# Patient Record
Sex: Male | Born: 1955 | Race: White | Hispanic: No | Smoking: Never smoker
Health system: Southern US, Community
[De-identification: ages and names within clinical notes are randomized; demographics above are authoritative.]

## PROBLEM LIST (undated history)

## (undated) DIAGNOSIS — Z87442 Personal history of urinary calculi: Secondary | ICD-10-CM

## (undated) DIAGNOSIS — R569 Unspecified convulsions: Secondary | ICD-10-CM

## (undated) DIAGNOSIS — K4020 Bilateral inguinal hernia, without obstruction or gangrene, not specified as recurrent: Secondary | ICD-10-CM

## (undated) DIAGNOSIS — M1990 Unspecified osteoarthritis, unspecified site: Secondary | ICD-10-CM

## (undated) DIAGNOSIS — E782 Mixed hyperlipidemia: Secondary | ICD-10-CM

## (undated) DIAGNOSIS — Z973 Presence of spectacles and contact lenses: Secondary | ICD-10-CM

---

## 1979-07-24 HISTORY — PX: CYSTOSCOPY/RETROGRADE/URETEROSCOPY/STONE EXTRACTION WITH BASKET: SHX5317

## 1980-07-23 HISTORY — PX: APPENDECTOMY: SHX54

## 2004-06-15 ENCOUNTER — Emergency Department (HOSPITAL_COMMUNITY): Admission: EM | Admit: 2004-06-15 | Discharge: 2004-06-15 | Payer: Self-pay | Admitting: Emergency Medicine

## 2004-06-21 ENCOUNTER — Emergency Department (HOSPITAL_COMMUNITY): Admission: EM | Admit: 2004-06-21 | Discharge: 2004-06-21 | Payer: Self-pay | Admitting: *Deleted

## 2005-06-17 IMAGING — CT CT ABDOMEN W/O CM
1 series · 15 of 32 positions shown, 19 images · non-contrast
Comparison: 06/15/2004.

CLINICAL DATA: Increased right flank pain. Recently demonstrated right ureteral
calculus.

ABDOMEN CT WITHOUT CONTRAST - URINARY STONE PROTOCOL
TECHNIQUE: Multidetector CT imaging of the abdomen was performed following the
urinary stone protocol.  No oral or intravenous contrast was administered.
TECHNIQUE: Multidetector CT imaging of the pelvis was performed following the

[Series 2: renal stone · axial · 0.70mm/px · z∈[-510,-115]mm · 15 of 87 slices shown, 19 images]
[im 6/87  soft-tissue]
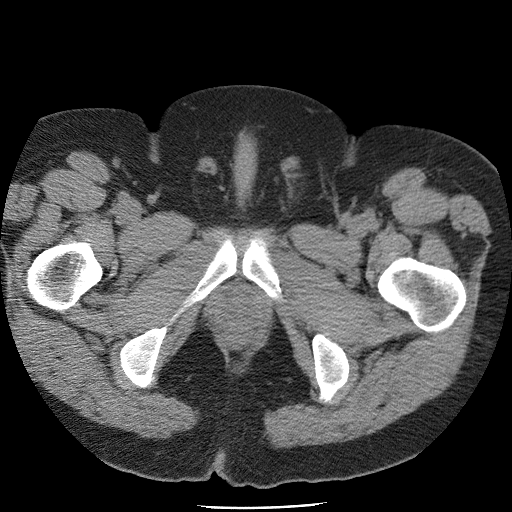
[im 6/87  bone]
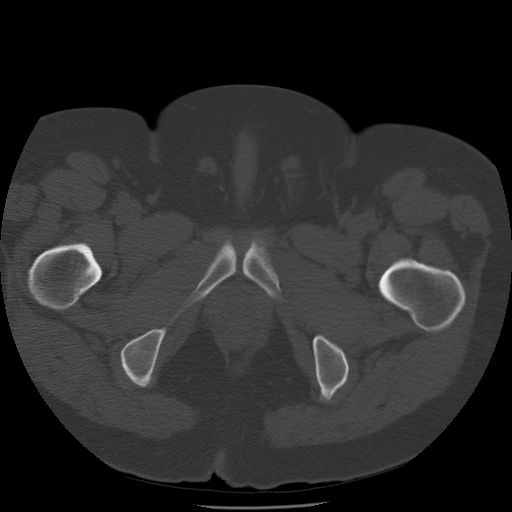
[im 12/87  soft-tissue]
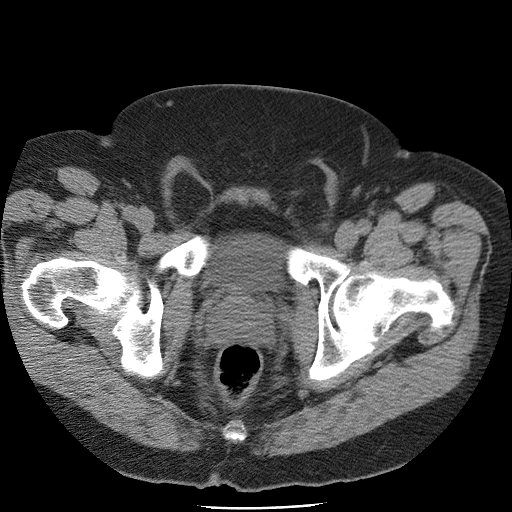
[im 17/87  soft-tissue]
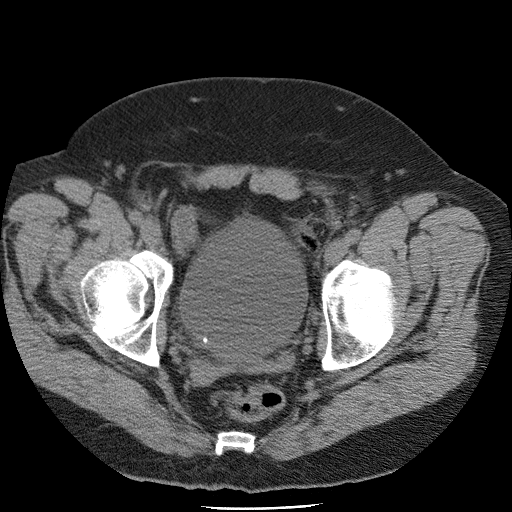
[im 25/87  soft-tissue]
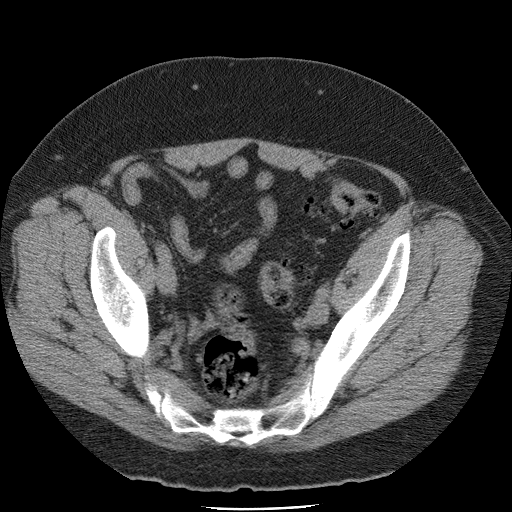
[im 31/87  soft-tissue]
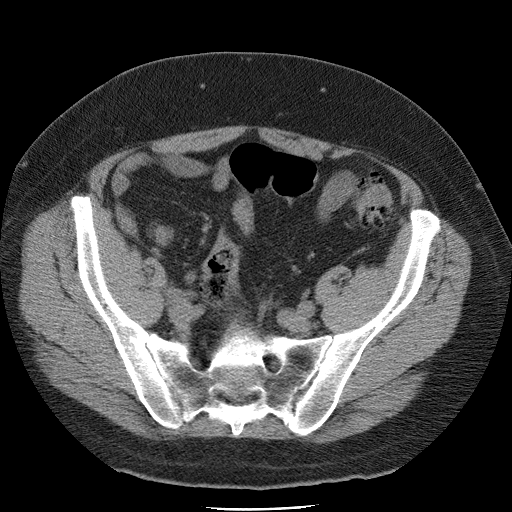
[im 37/87  soft-tissue]
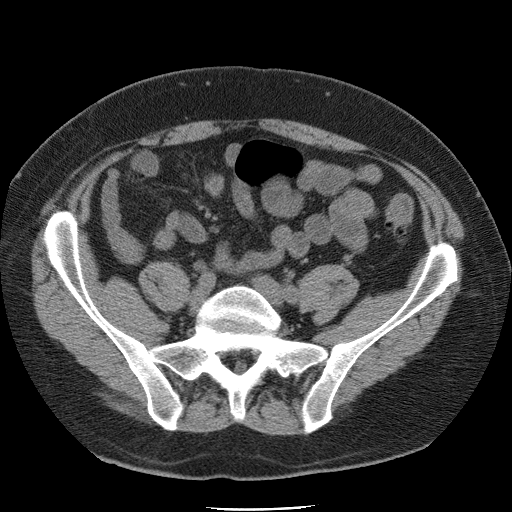
[im 45/87  soft-tissue]
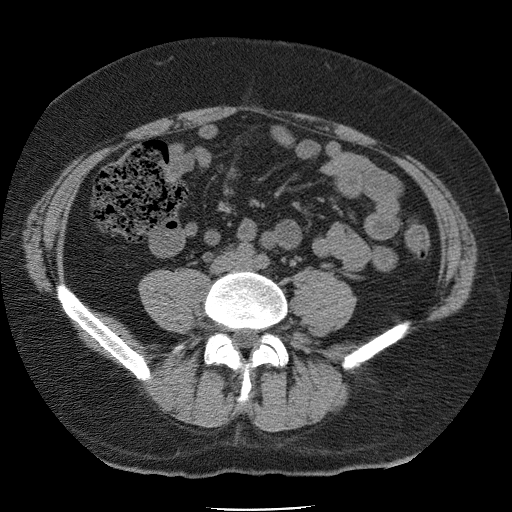
[im 50/87  soft-tissue]
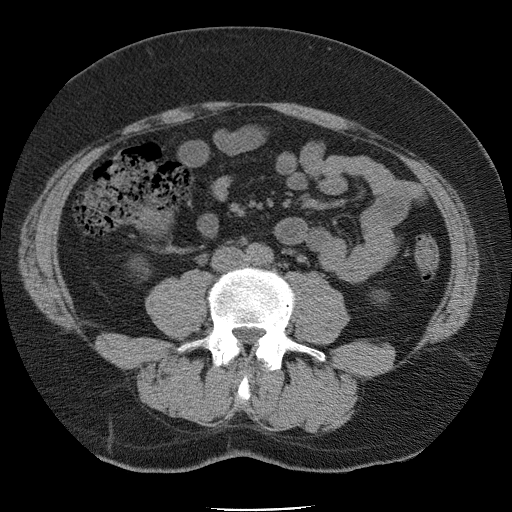
[im 56/87  soft-tissue]
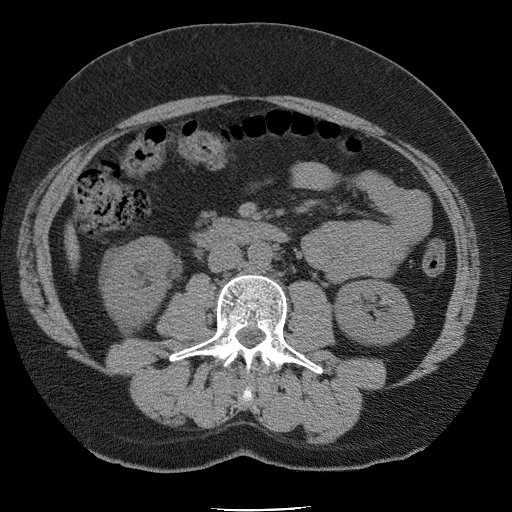
[im 56/87  bone]
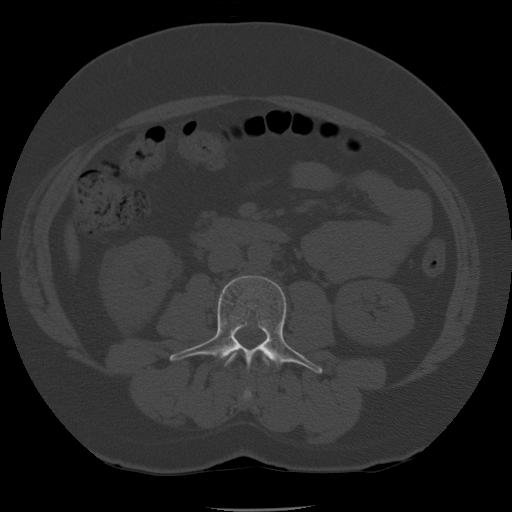
[im 62/87  soft-tissue]
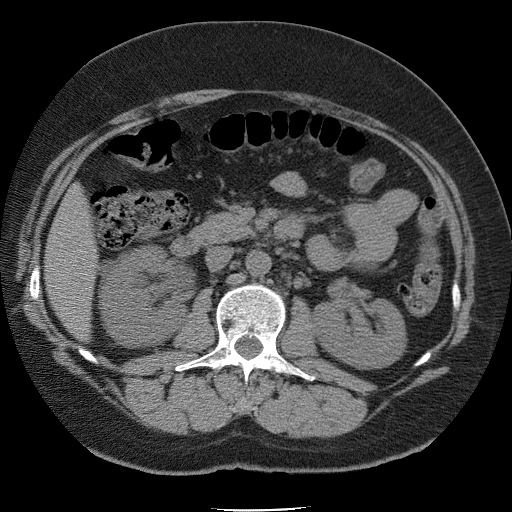
[im 70/87  soft-tissue]
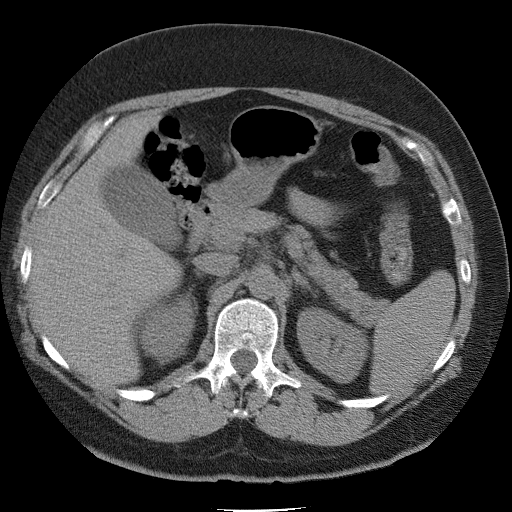
[im 75/87  soft-tissue]
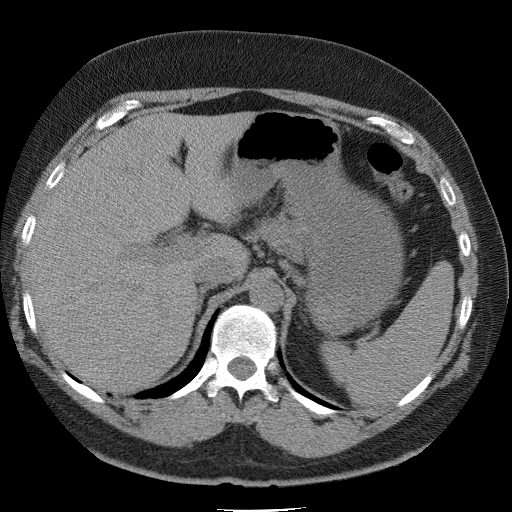
[im 75/87  lung]
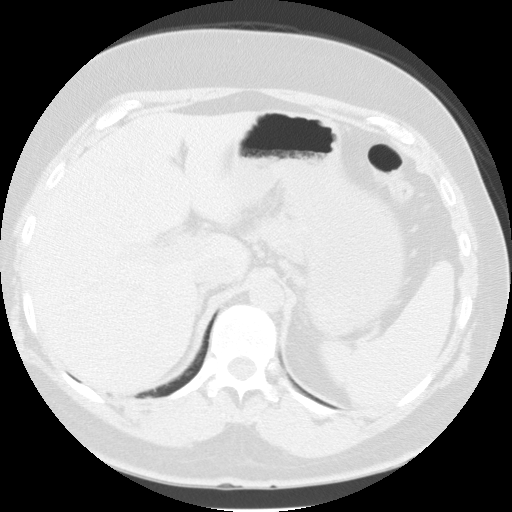
[im 78/87  lung]
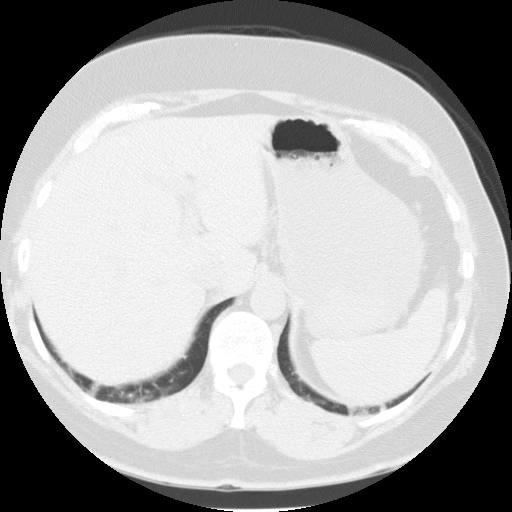
[im 81/87  soft-tissue]
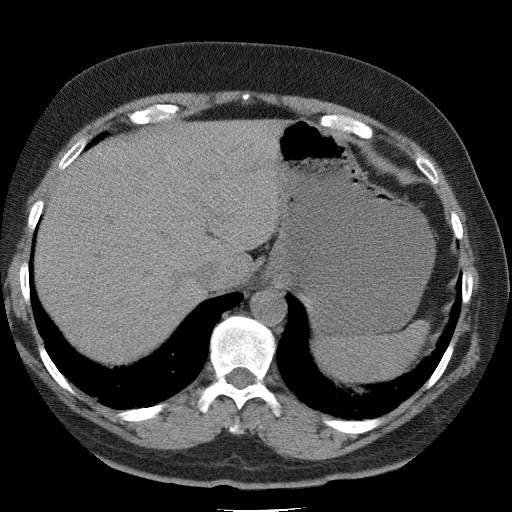
[im 81/87  lung]
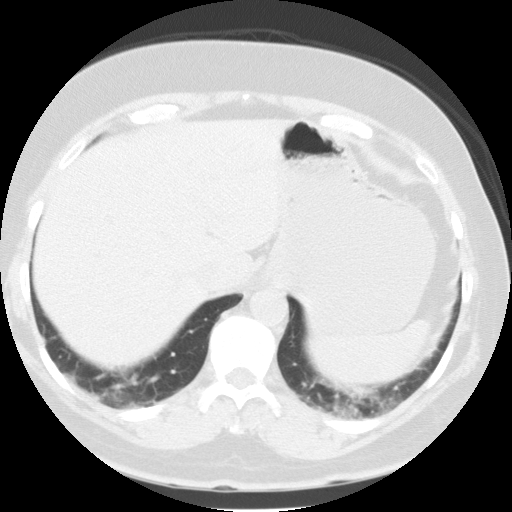
[im 84/87  lung]
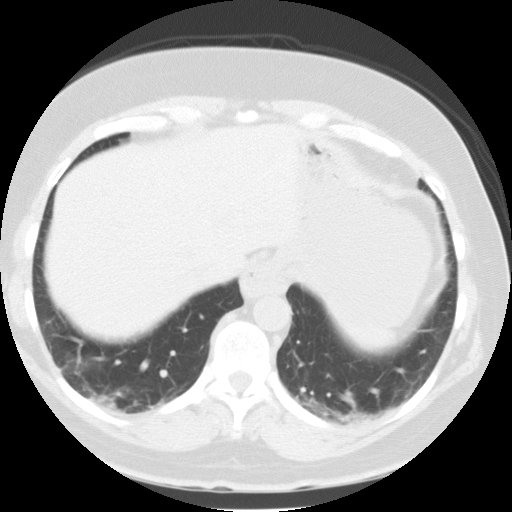

[15 of 32 positions shown; findings below may reference images not displayed]

FINDINGS: Mildly progressive dilatation of the right renal collecting system
and ureter. Interval mild right perinephric fluid. Mild interval increase in
size of the right kidney with interval ill-defined margins. Stable normal
appearing left kidney. No ureteral calculi in the abdomen. No renal calculi
seen. Lumbar spine degenerative changes.

IMPRESSION

Mild progressive right hydronephrosis and hydroureter with interval mild
enlargement of the right kidney and interval mild right perinephric fluid.

PELVIS CT WITHOUT CONTRAST - URINARY STONE PROTOCOL
FINDINGS: Mildly progressive dilatation of the right ureter to the level of a 4
mm calculus at the ureterovesical junction. Multiple sigmoid colon diverticula.
Unremarkable bones.

IMPRESSION

Mildly progressive dilatation of the right ureter due to a 4 mm distal ureteral
calculus at the ureterovesical junction.

## 2008-07-23 HISTORY — PX: COLONOSCOPY: SHX174

## 2011-07-24 DIAGNOSIS — R569 Unspecified convulsions: Secondary | ICD-10-CM

## 2011-07-24 HISTORY — DX: Unspecified convulsions: R56.9

## 2012-03-11 ENCOUNTER — Ambulatory Visit (INDEPENDENT_AMBULATORY_CARE_PROVIDER_SITE_OTHER): Payer: BC Managed Care – PPO | Admitting: Surgery

## 2012-04-18 ENCOUNTER — Encounter (INDEPENDENT_AMBULATORY_CARE_PROVIDER_SITE_OTHER): Payer: Self-pay | Admitting: General Surgery

## 2012-04-22 ENCOUNTER — Encounter (INDEPENDENT_AMBULATORY_CARE_PROVIDER_SITE_OTHER): Payer: Self-pay | Admitting: Surgery

## 2012-04-22 ENCOUNTER — Ambulatory Visit (INDEPENDENT_AMBULATORY_CARE_PROVIDER_SITE_OTHER): Payer: BC Managed Care – PPO | Admitting: Surgery

## 2012-04-22 VITALS — BP 110/70 | HR 80 | Resp 14 | Ht 74.0 in | Wt 231.0 lb

## 2012-04-22 DIAGNOSIS — K4020 Bilateral inguinal hernia, without obstruction or gangrene, not specified as recurrent: Secondary | ICD-10-CM | POA: Insufficient documentation

## 2012-04-22 DIAGNOSIS — K4090 Unilateral inguinal hernia, without obstruction or gangrene, not specified as recurrent: Secondary | ICD-10-CM

## 2012-04-22 NOTE — Progress Notes (Signed)
Subjective:     Patient ID: Edward Hart, male   DOB: 1955/11/11, 56 y.o.   MRN: [id number]  HPI  AVA DEGUIRE  1956-06-03 [id number]  Patient Care Team: Lupita Raider, MD as PCP - General (Family Medicine)  This patient is a 56 y.o.male who presents today for surgical evaluation.   For visit: Right inguinal hernia.  Pleasant active male.  Pleas Koch and works in [place] many months of the year.  Was found to have an inguinal hernia on physical exam a few years ago.  Only occasionally sensitive.  No severe pain.  Walks about a mile or two a day.  Regular bowel movements.  Normal colonoscopy three years ago.  Had an open appendectomy done in the 1980s.  No other surgeries.  No history of infections.  Because he was due to do a lot more traveling this year, it was recommended by his primary care physician to meet up with a surgeon to consider surgery at some point.  Patient Active Problem List  Diagnosis  . Inguinal hernia, right    Past Medical History  Diagnosis Date  . HLD (hyperlipidemia)     Past Surgical History  Procedure Date  . Appendectomy 1984  . Kidney stone surgery     History   Social History  . Marital Status: Married    Spouse Name: N/A    Number of Children: N/A  . Years of Education: N/A   Occupational History  . Not on file.   Social History Main Topics  . Smoking status: Never Smoker   . Smokeless tobacco: Not on file  . Alcohol Use: No  . Drug Use: No  . Sexually Active: Not on file   Other Topics Concern  . Not on file   Social History Narrative  . No narrative on file    Family History  Problem Relation Age of Onset  . Lung cancer Father   . Alzheimer's disease Mother   . Diabetes Brother     Current Outpatient Prescriptions  Medication Sig Dispense Refill  . simvastatin (ZOCOR) 40 MG tablet Take 40 mg by mouth every evening. Half tablet         No Known Allergies  BP 110/70  Pulse 80  Resp 14  Ht 6\' 2"  (1.88 m)  Wt 231  lb (104.781 kg)  BMI 29.66 kg/m2  No results found.   Review of Systems  Constitutional: Negative for fever, chills and diaphoresis.  HENT: Negative for nosebleeds, sore throat, facial swelling, mouth sores, trouble swallowing and ear discharge.   Eyes: Negative for photophobia, discharge and visual disturbance.  Respiratory: Negative for choking, chest tightness, shortness of breath and stridor.   Cardiovascular: Negative for chest pain and palpitations.  Gastrointestinal: Negative for nausea, vomiting, abdominal pain, diarrhea, constipation, blood in stool, abdominal distention, anal bleeding and rectal pain.  Genitourinary: Negative for dysuria, urgency, difficulty urinating and testicular pain.  Musculoskeletal: Negative for myalgias, back pain, arthralgias and gait problem.  Skin: Negative for color change, pallor, rash and wound.  Neurological: Negative for dizziness, speech difficulty, weakness, numbness and headaches.  Hematological: Negative for adenopathy. Does not bruise/bleed easily.  Psychiatric/Behavioral: Negative for hallucinations, confusion and agitation.       Objective:   Physical Exam  Constitutional: He is oriented to person, place, and time. He appears well-developed and well-nourished. No distress.  HENT:  Head: Normocephalic.  Mouth/Throat: Oropharynx is clear and moist. No oropharyngeal exudate.  Eyes: Conjunctivae normal and EOM  are normal. Pupils are equal, round, and reactive to light. No scleral icterus.  Neck: Normal range of motion. Neck supple. No tracheal deviation present.  Cardiovascular: Normal rate, regular rhythm and intact distal pulses.   Pulmonary/Chest: Effort normal and breath sounds normal. No respiratory distress.  Abdominal: Soft. He exhibits no distension. There is no tenderness. A hernia is present. Hernia confirmed positive in the right inguinal area and confirmed positive in the left inguinal area.    Musculoskeletal: Normal range  of motion. He exhibits no tenderness.  Lymphadenopathy:    He has no cervical adenopathy.       Right: No inguinal adenopathy present.       Left: No inguinal adenopathy present.  Neurological: He is alert and oriented to person, place, and time. No cranial nerve deficit. He exhibits normal muscle tone. Coordination normal.  Skin: Skin is warm and dry. No rash noted. He is not diaphoretic. No erythema. No pallor.  Psychiatric: He has a normal mood and affect. His behavior is normal. Judgment and thought content normal.       Assessment:     BIH, R>L    Plan:     I think he needs repair.  Because they're bilateral, I think he would benefit from a laparoscopic approach.  He is interested in this.  However, he travels to [place] for work and is often there for months.  He was hoping to wait 6-12 months before doing this.  He denies any symptoms.  I cautioned him and waning of long, but he's had this for several years so it is probably safe.  He feels reassured and understands the risk of incarceration and stimulation and to call us if he has worsening symptoms to do it sooner.  I noted he would need at least a few weeks to have time to recover from surgery.  This is not a situation where we do surgery and he goes flying to [place] a few days later.  He expressed understanding.  The anatomy & physiology of the abdominal wall and pelvic floor was discussed.  The pathophysiology of hernias in the inguinal and pelvic region was discussed.  Natural history risks such as progressive enlargement, pain, incarceration & strangulation was discussed.   Contributors to complications such as smoking, obesity, diabetes, prior surgery, etc were discussed.    I feel the risks of no intervention will lead to serious problems that outweigh the operative risks; therefore, I recommended surgery to reduce and repair the hernia.  I explained laparoscopic techniques with possible need for an open approach.  I noted usual  use of mesh to patch and/or buttress hernia repair  Risks such as bleeding, infection, abscess, need for further treatment, heart attack, death, and other risks were discussed.  I noted a good likelihood this will help address the problem.   Goals of post-operative recovery were discussed as well.  Possibility that this will not correct all symptoms was explained.  I stressed the importance of low-impact activity, aggressive pain control, avoiding constipation, & not pushing through pain to minimize risk of post-operative chronic pain or injury. Possibility of reherniation was discussed.  We will work to minimize complications.     An educational handout further explaining the pathology & treatment options was given as well.  Questions were answered.  The patient expresses understanding & wishes to proceed with surgery in a few months.

## 2012-04-22 NOTE — Patient Instructions (Addendum)
See the Handout(s) we gave you.  Consider surgery.  Please call our office at [phone] if you wish to schedule surgery or if you have further questions / concerns.    Hernia A hernia occurs when an internal organ pushes out through a weak spot in the abdominal wall. Hernias most commonly occur in the groin and around the navel. Hernias often can be pushed back into place (reduced). Most hernias tend to get worse over time. Some abdominal hernias can get stuck in the opening (irreducible or incarcerated hernia) and cannot be reduced. An irreducible abdominal hernia which is tightly squeezed into the opening is at risk for impaired blood supply (strangulated hernia). A strangulated hernia is a medical emergency. Because of the risk for an irreducible or strangulated hernia, surgery may be recommended to repair a hernia. CAUSES   Heavy lifting.   Prolonged coughing.   Straining to have a bowel movement.   A cut (incision) made during an abdominal surgery.  HOME CARE INSTRUCTIONS   Bed rest is not required. You may continue your normal activities.   Avoid lifting more than 10 pounds (4.5 kg) or straining.   Cough gently. If you are a smoker it is best to stop. Even the best hernia repair can break down with the continual strain of coughing. Even if you do not have your hernia repaired, a cough will continue to aggravate the problem.   Do not wear anything tight over your hernia. Do not try to keep it in with an outside bandage or truss. These can damage abdominal contents if they are trapped within the hernia sac.   Eat a normal diet.   Avoid constipation. Straining over long periods of time will increase hernia size and encourage breakdown of repairs. If you cannot do this with diet alone, stool softeners may be used.  SEEK IMMEDIATE MEDICAL CARE IF:   You have a fever.   You develop increasing abdominal pain.   You feel nauseous or vomit.   Your hernia is stuck outside the  abdomen, looks discolored, feels hard, or is tender.   You have any changes in your bowel habits or in the hernia that are unusual for you.   You have increased pain or swelling around the hernia.   You cannot push the hernia back in place by applying gentle pressure while lying down.  MAKE SURE YOU:   Understand these instructions.   Will watch your condition.   Will get help right away if you are not doing well or get worse.  Document Released: 07/09/2005 Document Revised: 06/28/2011 Document Reviewed: 02/26/2008 Exit[employer] Patient Information 2012 [employer]. 

## 2012-04-30 ENCOUNTER — Encounter (INDEPENDENT_AMBULATORY_CARE_PROVIDER_SITE_OTHER): Payer: Self-pay

## 2012-08-07 DIAGNOSIS — G40209 Localization-related (focal) (partial) symptomatic epilepsy and epileptic syndromes with complex partial seizures, not intractable, without status epilepticus: Secondary | ICD-10-CM | POA: Insufficient documentation

## 2016-06-03 DIAGNOSIS — Z23 Encounter for immunization: Secondary | ICD-10-CM | POA: Diagnosis not present

## 2016-07-25 DIAGNOSIS — G40109 Localization-related (focal) (partial) symptomatic epilepsy and epileptic syndromes with simple partial seizures, not intractable, without status epilepticus: Secondary | ICD-10-CM | POA: Diagnosis not present

## 2016-09-11 DIAGNOSIS — Z0000 Encounter for general adult medical examination without abnormal findings: Secondary | ICD-10-CM | POA: Diagnosis not present

## 2016-09-11 DIAGNOSIS — E669 Obesity, unspecified: Secondary | ICD-10-CM | POA: Diagnosis not present

## 2016-09-11 DIAGNOSIS — N528 Other male erectile dysfunction: Secondary | ICD-10-CM | POA: Diagnosis not present

## 2016-09-11 DIAGNOSIS — E782 Mixed hyperlipidemia: Secondary | ICD-10-CM | POA: Diagnosis not present

## 2016-09-12 ENCOUNTER — Ambulatory Visit: Payer: Self-pay | Admitting: Surgery

## 2016-09-12 DIAGNOSIS — K4020 Bilateral inguinal hernia, without obstruction or gangrene, not specified as recurrent: Secondary | ICD-10-CM | POA: Diagnosis not present

## 2016-09-12 DIAGNOSIS — Z01818 Encounter for other preprocedural examination: Secondary | ICD-10-CM | POA: Diagnosis not present

## 2016-09-12 NOTE — H&P (Signed)
Edward Hart 09/12/2016 8:35 AM Location: [facility] Patient #: [id number] DOB: 12/21/1955 Married / Language: English / Race: White Male  History of Present Illness (Karson Chicas C. Kenitra Leventhal MD; 09/12/2016 9:10 AM) Patient words: NP BIH.  The patient is a 61 year old male who presents with an inguinal hernia. Note for "Inguinal hernia": Patient sent for surgical evaluation. Requested primary care physician Dr. Kimberlee Shaw. Concern for worsening inguinal hernias.  61-year-old male. Travels internationally often. Increasingly symptomatic right internal hernia. I felt like he had a small left contralateral hernia as well. I recommended laparoscopic bilateral inguinal hernia repairs with mesh in 2013. Due to his heavy travel schedule, he never felt he can get a time to get done. However the right groin swelling has gotten larger and become more painful now. Was recommended he reconsider surgery. Patient does not smoke. He can walk 2 miles a day without difficulty. Usually moves his bowels once a day. Sometimes every other day. Had an appendectomy but no other abdominal surgeries. No cardiopulmonary events. Not on blood thinners. Otherwise rather active.   Past Surgical History (Robin Gwynn, RMA; 09/12/2016 8:36 AM) Appendectomy Oral Surgery  Diagnostic Studies History (Robin Gwynn, RMA; 09/12/2016 8:36 AM) Colonoscopy 5-10 years ago  Allergies (Robin Gwynn, RMA; 09/12/2016 8:39 AM) No Known Drug Allergies 09/12/2016  Medication History (Robin Gwynn, RMA; 09/12/2016 8:44 AM) Fish Oil (435MG Capsule, Oral) Active. Keppra (Oral) Specific strength unknown - Active. Aspirin (81MG Tablet Chewable, Oral) Active. Medications Reconciled  Social History (Robin Gwynn, RMA; 09/12/2016 8:36 AM) Alcohol use Occasional alcohol use. Caffeine use Coffee, Tea. No drug use Tobacco use Never smoker.  Family History (Robin Gwynn, RMA; 09/12/2016 8:36 AM) Diabetes Mellitus  Brother.  Other Problems (Robin Gwynn, RMA; 09/12/2016 8:36 AM) Kidney Stone Seizure Disorder     Review of Systems (Robin Gwynn RMA; 09/12/2016 8:36 AM) General Not Present- Appetite Loss, Chills, Fatigue, Fever, Night Sweats, Weight Gain and Weight Loss. Skin Not Present- Change in Wart/Mole, Dryness, Hives, Jaundice, New Lesions, Non-Healing Wounds, Rash and Ulcer. HEENT Present- Wears glasses/contact lenses. Not Present- Earache, Hearing Loss, Hoarseness, Nose Bleed, Oral Ulcers, Ringing in the Ears, Seasonal Allergies, Sinus Pain, Sore Throat, Visual Disturbances and Yellow Eyes. Respiratory Not Present- Bloody sputum, Chronic Cough, Difficulty Breathing, Snoring and Wheezing. Breast Not Present- Breast Mass, Breast Pain, Nipple Discharge and Skin Changes. Cardiovascular Not Present- Chest Pain, Difficulty Breathing Lying Down, Leg Cramps, Palpitations, Rapid Heart Rate, Shortness of Breath and Swelling of Extremities. Gastrointestinal Not Present- Abdominal Pain, Bloating, Bloody Stool, Change in Bowel Habits, Chronic diarrhea, Constipation, Difficulty Swallowing, Excessive gas, Gets full quickly at meals, Hemorrhoids, Indigestion, Nausea, Rectal Pain and Vomiting. Male Genitourinary Present- Urgency. Not Present- Blood in Urine, Change in Urinary Stream, Frequency, Impotence, Nocturia, Painful Urination and Urine Leakage. Musculoskeletal Not Present- Back Pain, Joint Pain, Joint Stiffness, Muscle Pain, Muscle Weakness and Swelling of Extremities. Neurological Present- Seizures. Not Present- Decreased Memory, Fainting, Headaches, Numbness, Tingling, Tremor, Trouble walking and Weakness. Psychiatric Not Present- Anxiety, Bipolar, Change in Sleep Pattern, Depression, Fearful and Frequent crying. Endocrine Not Present- Cold Intolerance, Excessive Hunger, Hair Changes, Heat Intolerance, Hot flashes and New Diabetes. Hematology Not Present- Blood Thinners, Easy Bruising, Excessive bleeding,  Gland problems, HIV and Persistent Infections.  Vitals (Robin Gwynn RMA; 09/12/2016 8:45 AM) 09/12/2016 8:44 AM Weight: 228 lb Height: 73in Body Surface Area: 2.28 m Body Mass Index: 30.08 kg/m  Temp.: 97.6F  Pulse: 81 (Regular)  BP: 112/60 (Sitting, Left Arm, Standard)        Physical Exam (Elbridge Magowan C. Naeemah Jasmer MD; 09/12/2016 9:05 AM)  General Mental Status-Alert. General Appearance-Not in acute distress, Not Sickly. Orientation-Oriented X3. Hydration-Well hydrated. Voice-Normal.  Integumentary Global Assessment Upon inspection and palpation of skin surfaces of the - Axillae: non-tender, no inflammation or ulceration, no drainage. and Distribution of scalp and body hair is normal. General Characteristics Temperature - normal warmth is noted.  Head and Neck Head-normocephalic, atraumatic with no lesions or palpable masses. Face Global Assessment - atraumatic, no absence of expression. Neck Global Assessment - no abnormal movements, no bruit auscultated on the right, no bruit auscultated on the left, no decreased range of motion, non-tender. Trachea-midline. Thyroid Gland Characteristics - non-tender.  Eye Eyeball - Left-Extraocular movements intact, No Nystagmus. Eyeball - Right-Extraocular movements intact, No Nystagmus. Cornea - Left-No Hazy. Cornea - Right-No Hazy. Sclera/Conjunctiva - Left-No scleral icterus, No Discharge. Sclera/Conjunctiva - Right-No scleral icterus, No Discharge. Pupil - Left-Direct reaction to light normal. Pupil - Right-Direct reaction to light normal.  ENMT Ears Pinna - Left - no drainage observed, no generalized tenderness observed. Right - no drainage observed, no generalized tenderness observed. Nose and Sinuses External Inspection of the Nose - no destructive lesion observed. Inspection of the nares - Left - quiet respiration. Right - quiet respiration. Mouth and Throat Lips - Upper Lip - no  fissures observed, no pallor noted. Lower Lip - no fissures observed, no pallor noted. Nasopharynx - no discharge present. Oral Cavity/Oropharynx - Tongue - no dryness observed. Oral Mucosa - no cyanosis observed. Hypopharynx - no evidence of airway distress observed.  Chest and Lung Exam Inspection Movements - Normal and Symmetrical. Accessory muscles - No use of accessory muscles in breathing. Palpation Palpation of the chest reveals - Non-tender. Auscultation Breath sounds - Normal and Clear.  Cardiovascular Auscultation Rhythm - Regular. Murmurs & Other Heart Sounds - Auscultation of the heart reveals - No Murmurs and No Systolic Clicks.  Abdomen Inspection Inspection of the abdomen reveals - No Visible peristalsis and No Abnormal pulsations. Umbilicus - No Bleeding, No Urine drainage. Palpation/Percussion Palpation and Percussion of the abdomen reveal - Soft, Non Tender, No Rebound tenderness, No Rigidity (guarding) and No Cutaneous hyperesthesia. Note: Abdomen soft. Nontender, nondistended. No guarding. No diastasis. No umbilical nor other hernias  Male Genitourinary Sexual Maturity Tanner 5 - Adult hair pattern and Adult penile size and shape. Note: Large right inguinal hernia going down to proximal scrotum. Sensitive smaller left inguinal hernia. Normal external genitalia. Epididymi, testes, and spermatic cords normal without any masses.  Peripheral Vascular Upper Extremity Inspection - Left - No Cyanotic nailbeds, Not Ischemic. Right - No Cyanotic nailbeds, Not Ischemic.  Neurologic Neurologic evaluation reveals -normal attention span and ability to concentrate, able to name objects and repeat phrases. Appropriate fund of knowledge , normal sensation and normal coordination. Mental Status Affect - not angry, not paranoid. Cranial Nerves-Normal Bilaterally. Gait-Normal.  Neuropsychiatric Mental status exam performed with findings of-able to articulate well with  normal speech/language, rate, volume and coherence, thought content normal with ability to perform basic computations and apply abstract reasoning and no evidence of hallucinations, delusions, obsessions or homicidal/suicidal ideation.  Musculoskeletal Global Assessment Spine, Ribs and Pelvis - no instability, subluxation or laxity. Right Upper Extremity - no instability, subluxation or laxity.  Lymphatic Head & Neck  General Head & Neck Lymphatics: Bilateral - Description - No Localized lymphadenopathy. Axillary  General Axillary Region: Bilateral - Description - No Localized lymphadenopathy. Femoral & Inguinal  Generalized Femoral & Inguinal Lymphatics: Left -   Description - No Localized lymphadenopathy. Right - Description - No Localized lymphadenopathy.    Assessment & Plan (Jerimah Witucki C. Reg Bircher MD; 09/12/2016 9:06 AM)  BILATERAL INGUINAL HERNIA WITHOUT OBSTRUCTION OR GANGRENE, RECURRENCE NOT SPECIFIED (K40.20) Impression: Enlarging and more symptomatic right inguinal hernia with left inguinal hernia as well.  Held off on surgery as recommended in 2013. Increasingly symptomatic so more motivated to get repaired now.  We will coordinate a convenient time. Patient travels internationally and was hoping for a few weeks from now when he has time to recover more safely. We will try to accommodate him.  PREOP - ING HERNIA - ENCOUNTER FOR PREOPERATIVE EXAMINATION FOR GENERAL SURGICAL PROCEDURE (Z01.818)  Current Plans You are being scheduled for surgery- Our schedulers will call you.  You should hear from our office's scheduling department within 5 working days about the location, date, and time of surgery. We try to make accommodations for patient's preferences in scheduling surgery, but sometimes the OR schedule or the surgeon's schedule prevents us from making those accommodations.  If you have not heard from our office ([phone]) in 5 working days, call the office and ask for your  surgeon's nurse.  If you have other questions about your diagnosis, plan, or surgery, call the office and ask for your surgeon's nurse.  Written instructions provided The anatomy & physiology of the abdominal wall and pelvic floor was discussed. The pathophysiology of hernias in the inguinal and pelvic region was discussed. Natural history risks such as progressive enlargement, pain, incarceration, and strangulation was discussed. Contributors to complications such as smoking, obesity, diabetes, prior surgery, etc were discussed.  I feel the risks of no intervention will lead to serious problems that outweigh the operative risks; therefore, I recommended surgery to reduce and repair the hernia. I explained laparoscopic techniques with possible need for an open approach. I noted usual use of mesh to patch and/or buttress hernia repair  Risks such as bleeding, infection, abscess, need for further treatment, heart attack, death, and other risks were discussed. I noted a good likelihood this will help address the problem. Goals of post-operative recovery were discussed as well. Possibility that this will not correct all symptoms was explained. I stressed the importance of low-impact activity, aggressive pain control, avoiding constipation, & not pushing through pain to minimize risk of post-operative chronic pain or injury. Possibility of reherniation was discussed. We will work to minimize complications.  An educational handout further explaining the pathology & treatment options was given as well. Questions were answered. The patient expresses understanding & wishes to proceed with surgery.  Pt Education - Pamphlet Given - Laparoscopic Hernia Repair: discussed with patient and provided information. Pt Education - [facility] Pain Control (Agape Hardiman) Pt Education - [facility] Hernia Post-Op HCI (Azrael Huss): discussed with patient and provided information.  Adriauna Campton C. Reena Borromeo, M.D., F.A.C.S. Gastrointestinal  and Minimally Invasive Surgery [facility] [address], [address] [place], [place] [address] [phone] Main / Paging   

## 2016-10-02 ENCOUNTER — Encounter (HOSPITAL_BASED_OUTPATIENT_CLINIC_OR_DEPARTMENT_OTHER): Payer: Self-pay | Admitting: *Deleted

## 2016-10-02 NOTE — Progress Notes (Signed)
NPO AFTER MN.  ARRIVE AT 0600.  NEEDS HG.  WILL DO HIBICLENS SHOWER HS BEFORE AND AM DOS.

## 2016-10-08 ENCOUNTER — Encounter (HOSPITAL_BASED_OUTPATIENT_CLINIC_OR_DEPARTMENT_OTHER)
Admission: RE | Disposition: A | Discharge status: Home / Self Care | Payer: Self-pay | Source: Ambulatory Visit | Attending: Surgery

## 2016-10-08 ENCOUNTER — Ambulatory Visit (HOSPITAL_BASED_OUTPATIENT_CLINIC_OR_DEPARTMENT_OTHER): Payer: BLUE CROSS/BLUE SHIELD | Admitting: Anesthesiology

## 2016-10-08 ENCOUNTER — Ambulatory Visit (HOSPITAL_BASED_OUTPATIENT_CLINIC_OR_DEPARTMENT_OTHER)
Admission: RE | Admit: 2016-10-08 | Discharge: 2016-10-08 | Disposition: A | Discharge status: Home / Self Care | Payer: BLUE CROSS/BLUE SHIELD | Source: Ambulatory Visit | Attending: Surgery | Admitting: Surgery

## 2016-10-08 ENCOUNTER — Encounter (HOSPITAL_BASED_OUTPATIENT_CLINIC_OR_DEPARTMENT_OTHER): Payer: Self-pay | Admitting: Anesthesiology

## 2016-10-08 DIAGNOSIS — Z79899 Other long term (current) drug therapy: Secondary | ICD-10-CM | POA: Insufficient documentation

## 2016-10-08 DIAGNOSIS — M1990 Unspecified osteoarthritis, unspecified site: Secondary | ICD-10-CM | POA: Diagnosis not present

## 2016-10-08 DIAGNOSIS — R569 Unspecified convulsions: Secondary | ICD-10-CM | POA: Diagnosis not present

## 2016-10-08 DIAGNOSIS — Z7982 Long term (current) use of aspirin: Secondary | ICD-10-CM | POA: Insufficient documentation

## 2016-10-08 DIAGNOSIS — K4020 Bilateral inguinal hernia, without obstruction or gangrene, not specified as recurrent: Secondary | ICD-10-CM | POA: Diagnosis not present

## 2016-10-08 HISTORY — PX: INGUINAL HERNIA REPAIR: SHX194

## 2016-10-08 HISTORY — DX: Unspecified osteoarthritis, unspecified site: M19.90

## 2016-10-08 HISTORY — DX: Personal history of urinary calculi: Z87.442

## 2016-10-08 HISTORY — DX: Unspecified convulsions: R56.9

## 2016-10-08 HISTORY — DX: Bilateral inguinal hernia, without obstruction or gangrene, not specified as recurrent: K40.20

## 2016-10-08 HISTORY — DX: Mixed hyperlipidemia: E78.2

## 2016-10-08 HISTORY — PX: INSERTION OF MESH: SHX5868

## 2016-10-08 HISTORY — DX: Presence of spectacles and contact lenses: Z97.3

## 2016-10-08 LAB — POCT HEMOGLOBIN-HEMACUE: Hemoglobin: 15.8 g/dL (ref 13.0–17.0)

## 2016-10-08 SURGERY — REPAIR, HERNIA, INGUINAL, BILATERAL, LAPAROSCOPIC
Anesthesia: General | Site: Abdomen | Laterality: Bilateral

## 2016-10-08 MED ORDER — LACTATED RINGERS IV SOLN
INTRAVENOUS | Status: DC
  Administered 2016-10-08: 07:00:00 via INTRAVENOUS
  Filled 2016-10-08: qty 1000

## 2016-10-08 MED ORDER — ACETAMINOPHEN 500 MG PO TABS
1000.0000 mg | ORAL_TABLET | ORAL | Status: AC
  Administered 2016-10-08: 1000 mg via ORAL
  Filled 2016-10-08: qty 2

## 2016-10-08 MED ORDER — BUPIVACAINE-EPINEPHRINE 0.25% -1:200000 IJ SOLN
INTRAMUSCULAR | Status: DC | PRN
  Administered 2016-10-08: 60 mL

## 2016-10-08 MED ORDER — METHOCARBAMOL 1000 MG/10ML IJ SOLN
1000.0000 mg | Freq: Four times a day (QID) | INTRAVENOUS | Status: DC | PRN
  Filled 2016-10-08: qty 10

## 2016-10-08 MED ORDER — FENTANYL CITRATE (PF) 100 MCG/2ML IJ SOLN
INTRAMUSCULAR | Status: AC
  Filled 2016-10-08: qty 2

## 2016-10-08 MED ORDER — ROCURONIUM BROMIDE 100 MG/10ML IV SOLN
INTRAVENOUS | Status: DC | PRN
  Administered 2016-10-08: 50 mg via INTRAVENOUS

## 2016-10-08 MED ORDER — LIDOCAINE 2% (20 MG/ML) 5 ML SYRINGE
INTRAMUSCULAR | Status: AC
  Filled 2016-10-08: qty 5

## 2016-10-08 MED ORDER — SODIUM CHLORIDE 0.9% FLUSH
3.0000 mL | Freq: Two times a day (BID) | INTRAVENOUS | Status: DC
  Filled 2016-10-08: qty 3

## 2016-10-08 MED ORDER — METHOCARBAMOL 500 MG PO TABS
1000.0000 mg | ORAL_TABLET | Freq: Four times a day (QID) | ORAL | Status: DC | PRN
  Filled 2016-10-08: qty 2

## 2016-10-08 MED ORDER — SODIUM CHLORIDE 0.9% FLUSH
3.0000 mL | INTRAVENOUS | Status: DC | PRN
  Filled 2016-10-08: qty 3

## 2016-10-08 MED ORDER — ONDANSETRON HCL 4 MG/2ML IJ SOLN
INTRAMUSCULAR | Status: DC | PRN
  Administered 2016-10-08: 4 mg via INTRAVENOUS

## 2016-10-08 MED ORDER — ONDANSETRON HCL 4 MG/2ML IJ SOLN
INTRAMUSCULAR | Status: AC
  Filled 2016-10-08: qty 2

## 2016-10-08 MED ORDER — PHENYLEPHRINE 40 MCG/ML (10ML) SYRINGE FOR IV PUSH (FOR BLOOD PRESSURE SUPPORT)
PREFILLED_SYRINGE | INTRAVENOUS | Status: AC
  Filled 2016-10-08: qty 10

## 2016-10-08 MED ORDER — ACETAMINOPHEN 500 MG PO TABS
ORAL_TABLET | ORAL | Status: AC
  Filled 2016-10-08: qty 2

## 2016-10-08 MED ORDER — MEPERIDINE HCL 25 MG/ML IJ SOLN
6.2500 mg | INTRAMUSCULAR | Status: DC | PRN
  Filled 2016-10-08: qty 1

## 2016-10-08 MED ORDER — HYDRALAZINE HCL 20 MG/ML IJ SOLN
INTRAMUSCULAR | Status: AC
  Filled 2016-10-08: qty 1

## 2016-10-08 MED ORDER — OXYCODONE HCL 5 MG/5ML PO SOLN
5.0000 mg | Freq: Once | ORAL | Status: AC | PRN
  Filled 2016-10-08: qty 5

## 2016-10-08 MED ORDER — NEOSTIGMINE METHYLSULFATE 5 MG/5ML IV SOSY
PREFILLED_SYRINGE | INTRAVENOUS | Status: AC
  Filled 2016-10-08: qty 5

## 2016-10-08 MED ORDER — MIDAZOLAM HCL 2 MG/2ML IJ SOLN
INTRAMUSCULAR | Status: AC
  Filled 2016-10-08: qty 2

## 2016-10-08 MED ORDER — PHENYLEPHRINE 40 MCG/ML (10ML) SYRINGE FOR IV PUSH (FOR BLOOD PRESSURE SUPPORT)
PREFILLED_SYRINGE | INTRAVENOUS | Status: DC | PRN
  Administered 2016-10-08 (×6): 80 ug via INTRAVENOUS

## 2016-10-08 MED ORDER — LEVETIRACETAM 750 MG PO TABS
750.0000 mg | ORAL_TABLET | Freq: Every day | ORAL | Status: DC
  Filled 2016-10-08: qty 1

## 2016-10-08 MED ORDER — NEOSTIGMINE METHYLSULFATE 5 MG/5ML IV SOSY
PREFILLED_SYRINGE | INTRAVENOUS | Status: DC | PRN
  Administered 2016-10-08: 2 mg via INTRAVENOUS

## 2016-10-08 MED ORDER — PROPOFOL 10 MG/ML IV BOLUS
INTRAVENOUS | Status: AC
  Filled 2016-10-08: qty 40

## 2016-10-08 MED ORDER — OXYCODONE HCL 5 MG PO TABS
5.0000 mg | ORAL_TABLET | Freq: Once | ORAL | Status: AC | PRN
  Administered 2016-10-08: 5 mg via ORAL
  Filled 2016-10-08: qty 1

## 2016-10-08 MED ORDER — CHLORHEXIDINE GLUCONATE CLOTH 2 % EX PADS
6.0000 | MEDICATED_PAD | Freq: Once | CUTANEOUS | Status: DC
  Filled 2016-10-08: qty 6

## 2016-10-08 MED ORDER — SODIUM CHLORIDE 0.9 % IV SOLN
250.0000 mL | INTRAVENOUS | Status: DC | PRN
  Filled 2016-10-08: qty 250

## 2016-10-08 MED ORDER — PROCHLORPERAZINE EDISYLATE 5 MG/ML IJ SOLN
5.0000 mg | INTRAMUSCULAR | Status: DC | PRN
  Filled 2016-10-08: qty 2

## 2016-10-08 MED ORDER — LIDOCAINE 2% (20 MG/ML) 5 ML SYRINGE
INTRAMUSCULAR | Status: DC | PRN
  Administered 2016-10-08: 50 mg via INTRAVENOUS

## 2016-10-08 MED ORDER — CEFAZOLIN SODIUM-DEXTROSE 2-4 GM/100ML-% IV SOLN
INTRAVENOUS | Status: AC
  Filled 2016-10-08: qty 100

## 2016-10-08 MED ORDER — GABAPENTIN 300 MG PO CAPS
ORAL_CAPSULE | ORAL | Status: AC
  Filled 2016-10-08: qty 1

## 2016-10-08 MED ORDER — LACTATED RINGERS IV SOLN
INTRAVENOUS | Status: DC
  Administered 2016-10-08: 12:00:00 via INTRAVENOUS
  Filled 2016-10-08: qty 1000

## 2016-10-08 MED ORDER — HYDROMORPHONE HCL 1 MG/ML IJ SOLN
INTRAMUSCULAR | Status: AC
  Filled 2016-10-08: qty 1

## 2016-10-08 MED ORDER — TRAMADOL HCL 50 MG PO TABS: 50.0000 mg | ORAL_TABLET | Freq: Four times a day (QID) | ORAL | 0 refills | Status: AC | PRN

## 2016-10-08 MED ORDER — GABAPENTIN 300 MG PO CAPS
300.0000 mg | ORAL_CAPSULE | ORAL | Status: AC
  Administered 2016-10-08: 300 mg via ORAL
  Filled 2016-10-08: qty 1

## 2016-10-08 MED ORDER — PROPOFOL 10 MG/ML IV BOLUS
INTRAVENOUS | Status: DC | PRN
  Administered 2016-10-08: 150 mg via INTRAVENOUS

## 2016-10-08 MED ORDER — CEFAZOLIN SODIUM-DEXTROSE 2-4 GM/100ML-% IV SOLN
2.0000 g | INTRAVENOUS | Status: AC
  Administered 2016-10-08: 2 g via INTRAVENOUS
  Filled 2016-10-08: qty 100

## 2016-10-08 MED ORDER — ACETAMINOPHEN 650 MG RE SUPP
650.0000 mg | RECTAL | Status: DC | PRN
  Filled 2016-10-08: qty 1

## 2016-10-08 MED ORDER — GLYCOPYRROLATE 0.2 MG/ML IV SOSY
PREFILLED_SYRINGE | INTRAVENOUS | Status: DC | PRN
  Administered 2016-10-08: .4 mg via INTRAVENOUS

## 2016-10-08 MED ORDER — CELECOXIB 200 MG PO CAPS
ORAL_CAPSULE | ORAL | Status: AC
  Filled 2016-10-08: qty 2

## 2016-10-08 MED ORDER — OXYCODONE HCL 5 MG PO TABS
ORAL_TABLET | ORAL | Status: AC
  Filled 2016-10-08: qty 1

## 2016-10-08 MED ORDER — DEXAMETHASONE SODIUM PHOSPHATE 10 MG/ML IJ SOLN
INTRAMUSCULAR | Status: AC
  Filled 2016-10-08: qty 1

## 2016-10-08 MED ORDER — CELECOXIB 400 MG PO CAPS
400.0000 mg | ORAL_CAPSULE | ORAL | Status: AC
  Administered 2016-10-08: 400 mg via ORAL
  Filled 2016-10-08: qty 1

## 2016-10-08 MED ORDER — DEXAMETHASONE SODIUM PHOSPHATE 4 MG/ML IJ SOLN
INTRAMUSCULAR | Status: DC | PRN
  Administered 2016-10-08: 10 mg via INTRAVENOUS

## 2016-10-08 MED ORDER — FENTANYL CITRATE (PF) 100 MCG/2ML IJ SOLN
INTRAMUSCULAR | Status: DC | PRN
  Administered 2016-10-08: 50 ug via INTRAVENOUS
  Administered 2016-10-08 (×2): 100 ug via INTRAVENOUS
  Administered 2016-10-08: 50 ug via INTRAVENOUS

## 2016-10-08 MED ORDER — FENTANYL CITRATE (PF) 100 MCG/2ML IJ SOLN
25.0000 ug | INTRAMUSCULAR | Status: DC | PRN
  Filled 2016-10-08: qty 1

## 2016-10-08 MED ORDER — MIDAZOLAM HCL 5 MG/5ML IJ SOLN
INTRAMUSCULAR | Status: DC | PRN
  Administered 2016-10-08: 2 mg via INTRAVENOUS

## 2016-10-08 MED ORDER — ROCURONIUM BROMIDE 50 MG/5ML IV SOSY
PREFILLED_SYRINGE | INTRAVENOUS | Status: AC
  Filled 2016-10-08: qty 5

## 2016-10-08 MED ORDER — HYDROMORPHONE HCL 1 MG/ML IJ SOLN
0.2500 mg | INTRAMUSCULAR | Status: DC | PRN
  Administered 2016-10-08: 0.25 mg via INTRAVENOUS
  Filled 2016-10-08: qty 0.5

## 2016-10-08 MED ORDER — ACETAMINOPHEN 325 MG PO TABS
650.0000 mg | ORAL_TABLET | ORAL | Status: DC | PRN
  Filled 2016-10-08: qty 2

## 2016-10-08 MED ORDER — PROMETHAZINE HCL 25 MG/ML IJ SOLN
6.2500 mg | INTRAMUSCULAR | Status: DC | PRN
  Filled 2016-10-08: qty 1

## 2016-10-08 SURGICAL SUPPLY — 42 items
BLADE SURG 11 STRL SS (BLADE) ×2 IMPLANT
CABLE HIGH FREQUENCY MONO STRZ (ELECTRODE) ×2 IMPLANT
CANISTER SUCT 3000ML PPV (MISCELLANEOUS) ×2 IMPLANT
CHLORAPREP W/TINT 26ML (MISCELLANEOUS) ×4 IMPLANT
COVER BACK TABLE 60X90IN (DRAPES) ×2 IMPLANT
COVER MAYO STAND STRL (DRAPES) ×2 IMPLANT
DEVICE SECURE STRAP 25 ABSORB (INSTRUMENTS) ×2 IMPLANT
DRAPE LAPAROSCOPIC ABDOMINAL (DRAPES) ×2 IMPLANT
DRAPE UTILITY XL STRL (DRAPES) ×2 IMPLANT
DRAPE WARM FLUID 44X44 (DRAPE) ×2 IMPLANT
DRSG TEGADERM 2-3/8X2-3/4 SM (GAUZE/BANDAGES/DRESSINGS) ×6 IMPLANT
DRSG TEGADERM 4X4.75 (GAUZE/BANDAGES/DRESSINGS) ×2 IMPLANT
ELECT REM PT RETURN 9FT ADLT (ELECTROSURGICAL) ×2
ELECTRODE REM PT RTRN 9FT ADLT (ELECTROSURGICAL) ×1 IMPLANT
GLOVE BIOGEL PI IND STRL 7.5 (GLOVE) ×4 IMPLANT
GLOVE BIOGEL PI INDICATOR 7.5 (GLOVE) ×4
GLOVE ECLIPSE 8.0 STRL XLNG CF (GLOVE) ×2 IMPLANT
GLOVE INDICATOR 8.0 STRL GRN (GLOVE) ×2 IMPLANT
GLOVE SURG SS PI 7.5 STRL IVOR (GLOVE) ×4 IMPLANT
GOWN STRL REUS W/TWL LRG LVL3 (GOWN DISPOSABLE) ×2 IMPLANT
GOWN STRL REUS W/TWL XL LVL3 (GOWN DISPOSABLE) ×4 IMPLANT
KIT RM TURNOVER CYSTO AR (KITS) ×2 IMPLANT
MESH ULTRAPRO 6X6 15CM15CM (Mesh General) ×6 IMPLANT
NEEDLE HYPO 22GX1.5 SAFETY (NEEDLE) ×2 IMPLANT
NS IRRIG 500ML POUR BTL (IV SOLUTION) ×2 IMPLANT
PACK BASIN DAY SURGERY FS (CUSTOM PROCEDURE TRAY) ×2 IMPLANT
PAD POSITIONING PINK XL (MISCELLANEOUS) ×2 IMPLANT
SCISSORS LAP 5X35 DISP (ENDOMECHANICALS) ×2 IMPLANT
SET IRRIG TUBING LAPAROSCOPIC (IRRIGATION / IRRIGATOR) IMPLANT
SLEEVE ADV FIXATION 5X100MM (TROCAR) ×2 IMPLANT
SPONGE GAUZE 2X2 8PLY STRL LF (GAUZE/BANDAGES/DRESSINGS) ×6 IMPLANT
SUT MNCRL AB 4-0 PS2 18 (SUTURE) ×2 IMPLANT
SUT VIC AB 2-0 SH 27 (SUTURE)
SUT VIC AB 2-0 SH 27X BRD (SUTURE) IMPLANT
SYR 20CC LL (SYRINGE) ×4 IMPLANT
TOWEL OR 17X24 6PK STRL BLUE (TOWEL DISPOSABLE) ×2 IMPLANT
TROCAR ADV FIXATION 5X100MM (TROCAR) ×2 IMPLANT
TROCAR BLADELESS OPT 5 100 (ENDOMECHANICALS) IMPLANT
TROCAR XCEL BLUNT TIP 100MML (ENDOMECHANICALS) ×2 IMPLANT
TUBE CONNECTING 12X1/4 (SUCTIONS) IMPLANT
TUBING INSUF HEATED (TUBING) ×2 IMPLANT
WATER STERILE IRR 500ML POUR (IV SOLUTION) ×4 IMPLANT

## 2016-10-08 NOTE — Op Note (Signed)
10/08/2016  9:42 AM  PATIENT:  Edward Hart  61 y.o. male  Patient Care Team: Mayra Neer, MD as PCP - General (Family Medicine) Michael Boston, MD as Consulting Physician (General Surgery)  PRE-OPERATIVE DIAGNOSIS:  BILATERAL INGUINAL HERNIAS  POST-OPERATIVE DIAGNOSIS:  BILATERAL INGUINAL HERNIAS  PROCEDURE:    LAPAROSCOPIC BILATERAL INGUINAL HERNIA REPAIR INSERTION OF MESH  SURGEON:  Adin Hector, MD  ASSISTANT: None  ANESTHESIA:     Regional ilioinguinal and genitofemoral and spermatic cord nerve blocks  General  EBL:  Total I/O In: 900 [I.V.:900] Out: 25 [Blood:25].  See anesthesia record  Delay start of Pharmacological VTE agent (>24hrs) due to surgical blood loss or risk of bleeding:  no  DRAINS: NONE  SPECIMEN:  NONE  DISPOSITION OF SPECIMEN:  N/A  COUNTS:  YES  PLAN OF CARE: Discharge to home after PACU  PATIENT DISPOSITION:  PACU - hemodynamically stable.  INDICATION: Patient with large right inguinal hernia and probable left inguinal hernia as well.  I recommended hernia reapir:  The anatomy & physiology of the abdominal wall and pelvic floor was discussed.  The pathophysiology of hernias in the inguinal and pelvic region was discussed.  Natural history risks such as progressive enlargement, pain, incarceration & strangulation was discussed.   Contributors to complications such as smoking, obesity, diabetes, prior surgery, etc were discussed.    I feel the risks of no intervention will lead to serious problems that outweigh the operative risks; therefore, I recommended surgery to reduce and repair the hernia.  I explained laparoscopic techniques with possible need for an open approach.  I noted usual use of mesh to patch and/or buttress hernia repair  Risks such as bleeding, infection, abscess, need for further treatment, heart attack, death, and other risks were discussed.  I noted a good likelihood this will help address the problem.   Goals of  post-operative recovery were discussed as well.  Possibility that this will not correct all symptoms was explained.  I stressed the importance of low-impact activity, aggressive pain control, avoiding constipation, & not pushing through pain to minimize risk of post-operative chronic pain or injury. Possibility of reherniation was discussed.  We will work to minimize complications.     An educational handout further explaining the pathology & treatment options was given as well.  Questions were answered.  The patient expresses understanding & wishes to proceed with surgery.  OR FINDINGS: Bilateral pantaloon-type double inguinal hernias.  Large right direct>indirect inguinal hernia.  Medium indirect>indirect left inguinal hernias  DESCRIPTION:  The patient was identified & brought into the operating room. The patient was positioned supine with arms tucked. SCDs were active during the entire case. The patient underwent general anesthesia without any difficulty.  The abdomen was prepped and draped in a sterile fashion. The patient's bladder was emptied.  A Surgical Timeout confirmed our plan.  I made a transverse incision through the inferior umbilical fold.  I made a small transverse nick through the anterior rectus fascia contralateral to the inguinal hernia side and placed a 0-vicryl stitch through the fascia.  I placed a Hasson trocar into the preperitoneal plane.  Entry was clean.  We induced carbon dioxide insufflation. Camera inspection revealed no injury.  I used a 12mm angled scope to bluntly free the peritoneum off the infraumbilical anterior abdominal wall.  I created enough of a preperitoneal pocket to place 73mm ports into the right & left mid-abdomen into this preperitoneal cavity.  I focused attention on the  RIGHT pelvis since that was the dominant hernia side.   I used blunt & focused sharp dissection to free the peritoneum off the flank and down to the pubic rim.  I freed the anteriolateral  bladder wall off the anteriolateral pelvic wall, sparing midline attachments.   I located a swath of peritoneum going into a hernia fascial defect at the  direct space consistent with  a direct space inguinal hernia..  I gradually freed the peritoneal hernia sac off safely and reduced it into the preperitoneal space.  I freed the peritoneum off the spermatic vessels & vas deferens.  I freed peritoneum off the retroperitoneum along the psoas muscle.  Obvious indirect inguinal hernia as well  Spermatic cord lipomas were dissected away & removed.  I checked & assured hemostasis.     I turned attention on the opposite  LEFT pelvis.  I did dissection in a similar, mirror-image fashion. The patient had an indirect inguinal hernia..   Direct space hernia as well.  Moderate Spermatic cord lipomas was dissected away & removed.    I checked & assured hemostasis.     I chose 15x15 cm sheets of ultra-lightweight polypropylene mesh (Ultrapro), one for each side.  I cut a single sigmoid-shaped slit ~6cm from a corner of each mesh.  I placed the meshes into the preperitoneal space & laid them as overlapping diamonds such that at the inferior points, a 6x6 cm corner flap rested in the true anterolateral pelvis, covering the obturator & femoral foramina.   I allowed the bladder to return to the pubis, this helping tuck the corners of the mesh in the anteriolateral pelvis.  The medial corners overlapped each other across midline cephalad to the pubic rim.   Given the numerous hernias of moderate size, I placed a third 15x15cm mesh in the center as a vertical diamond.  The lateral wings of the mesh overlap across the direct spaces and internal rings where the dominant hernias were.  This provided good coverage and reinforcement of the hernia repairs.  I also placed Securestrap tacks also the superior lateral corners, along the rectus muscles, and above the pubic rim.  I held the hernia sacs cephalad & evacuated carbon dioxide.   I closed the fascia with absorbable suture.  I closed the skin using 4-0 monocryl stitch.  Sterile dressings were applied.   The patient was extubated & arrived in the PACU in stable condition..  I had discussed postoperative care with the patient in the holding area.  Instructions are written in the chart.  I discussed operative findings, updated the patient's status, discussed probable steps to recovery, and gave postoperative recommendations to the patient's family.  Recommendations were made.  Questions were answered.  They expressed understanding & appreciation.   Adin Hector, M.D., F.A.C.S. Gastrointestinal and Minimally Invasive Surgery [facility] [address] [place], [place] [address] [phone] Main / Paging  10/08/2016 9:42 AM

## 2016-10-08 NOTE — Anesthesia Postprocedure Evaluation (Addendum)
Anesthesia Post Note  Patient: Edward Hart  Procedure(s) Performed: Procedure(s) (LRB): LAPAROSCOPIC BILATERAL INGUINAL HERNIA REPAIR (Bilateral) INSERTION OF MESH (Bilateral)  Patient location during evaluation: PACU Anesthesia Type: General Level of consciousness: awake and alert Pain management: pain level controlled Vital Signs Assessment: post-procedure vital signs reviewed and stable Respiratory status: spontaneous breathing, nonlabored ventilation, respiratory function stable and patient connected to nasal cannula oxygen Cardiovascular status: blood pressure returned to baseline and stable Postop Assessment: no signs of nausea or vomiting Anesthetic complications: no       Last Vitals:  Vitals:   10/08/16 1030 10/08/16 1045  BP: 123/75 114/62  Pulse: 80 67  Resp: 16 16  Temp:      Last Pain:  Vitals:   10/08/16 1045  TempSrc:   PainSc: 2                  Effie Berkshire

## 2016-10-08 NOTE — Transfer of Care (Signed)
Immediate Anesthesia Transfer of Care Note  Patient: Edward Hart  Procedure(s) Performed: Procedure(s): LAPAROSCOPIC BILATERAL INGUINAL HERNIA REPAIR (Bilateral) INSERTION OF MESH (Bilateral)  Patient Location: PACU  Anesthesia Type:General  Level of Consciousness: awake and oriented  Airway & Oxygen Therapy: Patient Spontanous Breathing and Patient connected to nasal cannula oxygen  Post-op Assessment: Report given to RN  Post vital signs: Reviewed and stable  Last Vitals:  Vitals:   10/08/16 0707 10/08/16 0951  BP: 131/78 124/80  Pulse: 65 93  Resp: 16 (!) 21  Temp: 37.2 C 37.2 C    Last Pain:  Vitals:   10/08/16 0707  TempSrc: Oral      Patients Stated Pain Goal: 7 (29/56/21 3086)  Complications: No apparent anesthesia complications

## 2016-10-08 NOTE — Interval H&P Note (Signed)
History and Physical Interval Note:  10/08/2016 7:37 AM  Edward Hart  has presented today for surgery, with the diagnosis of Hernias in both groins  The various methods of treatment have been discussed with the patient and family. After consideration of risks, benefits and other options for treatment, the patient has consented to  Procedure(s): [place] (Bilateral) INSERTION OF MESH (Bilateral) as a surgical intervention .  The patient's history has been reviewed, patient examined, no change in status, stable for surgery.  I have reviewed the patient's chart and labs.  Questions were answered to the patient's satisfaction.     Muaz Shorey C.

## 2016-10-08 NOTE — Anesthesia Procedure Notes (Signed)
Procedure Name: Intubation Date/Time: 10/08/2016 7:50 AM Performed by: Bethena Roys T Pre-anesthesia Checklist: Patient identified, Emergency Drugs available, Suction available and Patient being monitored Patient Re-evaluated:Patient Re-evaluated prior to inductionOxygen Delivery Method: Circle system utilized Preoxygenation: Pre-oxygenation with 100% oxygen Intubation Type: IV induction Ventilation: Mask ventilation without difficulty Laryngoscope Size: Mac and 4 Grade View: Grade I Tube type: Oral Tube size: 8.0 mm Number of attempts: 1 Airway Equipment and Method: Stylet and Oral airway Placement Confirmation: ETT inserted through vocal cords under direct vision,  positive ETCO2 and breath sounds checked- equal and bilateral Secured at: 22 cm Tube secured with: Tape Dental Injury: Teeth and Oropharynx as per pre-operative assessment

## 2016-10-08 NOTE — Discharge Instructions (Signed)
Post Anesthesia Home Care Instructions  Activity: Get plenty of rest for the remainder of the day. A responsible adult should stay with you for 24 hours following the procedure.  For the next 24 hours, DO NOT: -Drive a car -Paediatric nurse -Drink alcoholic beverages -Take any medication unless instructed by your physician -Make any legal decisions or sign important papers.  Meals: Start with liquid foods such as gelatin or soup. Progress to regular foods as tolerated. Avoid greasy, spicy, heavy foods. If nausea and/or vomiting occur, drink only clear liquids until the nausea and/or vomiting subsides. Call your physician if vomiting continues.  Special Instructions/Symptoms: Your throat may feel dry or sore from the anesthesia or the breathing tube placed in your throat during surgery. If this causes discomfort, gargle with warm salt water. The discomfort should disappear within 24 hours.  If you had a scopolamine patch placed behind your ear for the management of post- operative nausea and/or vomiting:  1. The medication in the patch is effective for 72 hours, after which it should be removed.  Wrap patch in a tissue and discard in the trash. Wash hands thoroughly with soap and water. 2. You may remove the patch earlier than 72 hours if you experience unpleasant side effects which may include dry mouth, dizziness or visual disturbances. 3. Avoid touching the patch. Wash your hands with soap and water after contact with the patch.   HERNIA REPAIR: POST OP INSTRUCTIONS  ######################################################################  EAT Gradually transition to a high fiber diet with a fiber supplement over the next few weeks after discharge.  Start with a pureed / full liquid diet (see below)  WALK Walk an hour a day.  Control your pain to do that.    CONTROL PAIN Control pain so that you can walk, sleep, tolerate sneezing/coughing, go up/down stairs.  HAVE A BOWEL  MOVEMENT DAILY Keep your bowels regular to avoid problems.  OK to try a laxative to override constipation.  OK to use an antidairrheal to slow down diarrhea.  Call if not better after 2 tries  CALL IF YOU HAVE PROBLEMS/CONCERNS Call if you are still struggling despite following these instructions. Call if you have concerns not answered by these instructions  ######################################################################    1. DIET: Follow a light bland diet the first 24 hours after arrival home, such as soup, liquids, crackers, etc.  Be sure to include lots of fluids daily.  Avoid fast food or heavy meals as your are more likely to get nauseated.  Eat a low fat the next few days after surgery. 2. Take your usually prescribed home medications unless otherwise directed. 3. PAIN CONTROL: a. Pain is best controlled by a usual combination of three different methods TOGETHER: i. Ice/Heat ii. Over the counter pain medication iii. Prescription pain medication b. Most patients will experience some swelling and bruising around the hernia(s) such as the bellybutton, groins, or old incisions.  Ice packs or heating pads (30-60 minutes up to 6 times a day) will help. Use ice for the first few days to help decrease swelling and bruising, then switch to heat to help relax tight/sore spots and speed recovery.  Some people prefer to use ice alone, heat alone, alternating between ice & heat.  Experiment to what works for you.  Swelling and bruising can take several weeks to resolve.   c. It is helpful to take an over-the-counter pain medication regularly for the first few weeks.  Choose one of the following that works best  for you: i. Naproxen (Aleve, etc)  Two 220mg  tabs twice a day ii. Ibuprofen (Advil, etc) Three 200mg  tabs four times a day (every meal & bedtime) iii. Acetaminophen (Tylenol, etc) 325-650mg  four times a day (every meal & bedtime) d. A  prescription for pain medication should be given  to you upon discharge.  Take your pain medication as prescribed.  i. If you are having problems/concerns with the prescription medicine (does not control pain, nausea, vomiting, rash, itching, etc), please call us [phone] to see if we need to switch you to a different pain medicine that will work better for you and/or control your side effect better. ii. If you need a refill on your pain medication, please contact your pharmacy.  They will contact our office to request authorization. Prescriptions will not be filled after 5 pm or on week-ends. 4. Avoid getting constipated.  Between the surgery and the pain medications, it is common to experience some constipation.  Increasing fluid intake and taking a fiber supplement (such as Metamucil, Citrucel, FiberCon, MiraLax, etc) 1-2 times a day regularly will usually help prevent this problem from occurring.  A mild laxative (prune juice, Milk of Magnesia, MiraLax, etc) should be taken according to package directions if there are no bowel movements after 48 hours.   5. Wash / shower every day.  You may shower over the dressings as they are waterproof.   6. Remove your waterproof bandages 5 days after surgery.  You may leave the incision open to air.  You may replace a dressing/Band-Aid to cover the incision for comfort if you wish.  Continue to shower over incision(s) after the dressing is off.    7. ACTIVITIES as tolerated:   a. You may resume regular (light) daily activities beginning the next day--such as daily self-care, walking, climbing stairs--gradually increasing activities as tolerated.  If you can walk 30 minutes without difficulty, it is safe to try more intense activity such as jogging, treadmill, bicycling, low-impact aerobics, swimming, etc. b. Save the most intensive and strenuous activity for last such as sit-ups, heavy lifting, contact sports, etc  Refrain from any heavy lifting or straining until you are off narcotics for pain control.    c. DO NOT PUSH THROUGH PAIN.  Let pain be your guide: If it hurts to do something, don't do it.  Pain is your body warning you to avoid that activity for another week until the pain goes down. d. You may drive when you are no longer taking prescription pain medication, you can comfortably wear a seatbelt, and you can safely maneuver your car and apply brakes. e. Dennis Bast may have sexual intercourse when it is comfortable.  8. FOLLOW UP in our office a. Please call [facility] at [phone] to set up an appointment to see your surgeon in the office for a follow-up appointment approximately 2-3 weeks after your surgery. b. Make sure that you call for this appointment the day you arrive home to insure a convenient appointment time. 9.  IF YOU HAVE DISABILITY OR FAMILY LEAVE FORMS, BRING THEM TO THE OFFICE FOR PROCESSING.  DO NOT GIVE THEM TO YOUR DOCTOR.  WHEN TO CALL us [phone]: 1. Poor pain control 2. Reactions / problems with new medications (rash/itching, nausea, etc)  3. Fever over 101.5 F (38.5 C) 4. Inability to urinate 5. Nausea and/or vomiting 6. Worsening swelling or bruising 7. Continued bleeding from incision. 8. Increased pain, redness, or drainage from the incision   The  clinic staff is available to answer your questions during regular business hours (8:30am-5pm).  Please dont hesitate to call and ask to speak to one of our nurses for clinical concerns.   If you have a medical emergency, go to the nearest emergency room or call 911.  A surgeon from [facility] is always on call at the hospitals in [facility], [place], [place], [place], [place]  [address] ?  [address] [address], [place], [place]   [address] MAIN: [phone] ? TOLL FREE: [phone] ? FAX: [phone] [email or web address]   Inguinal Hernia, Adult An inguinal hernia is when fat or the intestines push through the area where the leg meets the lower  abdomen (groin) and create a rounded lump (bulge). This condition develops over time. There are three types of inguinal hernias. These types include:  Hernias that can be pushed back into the belly (are reducible).  Hernias that are not reducible (are incarcerated).  Hernias that are not reducible and lose their blood supply (are strangulated). This type of hernia requires emergency surgery. What are the causes? This condition is caused by having a weak spot in the muscles or tissue. This weakness lets the hernia poke through. This condition can be triggered by:  Suddenly straining the muscles of the lower abdomen.  Lifting heavy objects.  Straining to have a bowel movement. Difficult bowel movements (constipation) can lead to this.  Coughing. What increases the risk? This condition is more likely to develop in:  Men.  Pregnant women.  People who:  Are overweight.  Work in jobs that require long periods of standing or heavy lifting.  Have had an inguinal hernia before.  Smoke or have lung disease. These factors can lead to long-lasting (chronic) coughing. What are the signs or symptoms? Symptoms can depend on the size of the hernia. Often, a small inguinal hernia has no symptoms. Symptoms of a larger hernia include:  A lump in the groin. This is easier to see when the person is standing. It might not be visible when he or she is lying down.  Pain or burning in the groin. This occurs especially when lifting, straining, or coughing.  A dull ache or a feeling of pressure in the groin.  A lump in the scrotum in men. Symptoms of a strangulated inguinal hernia can include:  A bulge in the groin that is very painful and tender to the touch.  A bulge that turns red or purple.  Fever, nausea, and vomiting.  The inability to have a bowel movement or to pass gas. How is this diagnosed? This condition is diagnosed with a medical history and physical exam. Your health care  provider may feel your groin area and ask you to cough. How is this treated? Treatment for this condition varies depending on the size of your hernia and whether you have symptoms. If you do not have symptoms, your health care provider may have you watch your hernia carefully and come in for follow-up visits. If your hernia is larger or if you have symptoms, your treatment will include surgery. Follow these instructions at home: Lifestyle   Drink enough fluid to keep your urine clear or pale yellow.  Eat a diet that includes a lot of fiber. Eat plenty of fruits, vegetables, and whole grains. Talk with your health care provider if you have questions.  Avoid lifting heavy objects.  Avoid standing for long periods of time.  Do not use  tobacco products, including cigarettes, chewing tobacco, or e-cigarettes. If you need help quitting, ask your health care provider.  Maintain a healthy weight. General instructions   Do not try to force the hernia back in.  Watch your hernia for any changes in color or size. Let your health care provider know if any changes occur.  Take over-the-counter and prescription medicines only as told by your health care provider.  Keep all follow-up visits as told by your health care provider. This is important. Contact a health care provider if:  You have a fever.  You have new symptoms.  Your symptoms get worse. Get help right away if:  You have pain in the groin that suddenly gets worse.  A bulge in the groin gets bigger suddenly and does not go down.  You are a man and you have a sudden pain in the scrotum, or the size of your scrotum suddenly changes.  A bulge in the groin area becomes red or purple and is painful to the touch.  You have nausea or vomiting that does not go away.  You feel your heart beating a lot more quickly than normal.  You cannot have a bowel movement or pass gas. This information is not intended to replace advice given to  you by your health care provider. Make sure you discuss any questions you have with your health care provider. Document Released: 11/25/2008 Document Revised: 12/15/2015 Document Reviewed: 05/19/2014 Elsevier Interactive Patient Education  2017 [employer].

## 2016-10-08 NOTE — Anesthesia Preprocedure Evaluation (Addendum)
Anesthesia Evaluation  Patient identified by MRN, date of birth, ID band Patient awake    Reviewed: Allergy & Precautions, NPO status , Patient's Chart, lab work & pertinent test results  Airway Mallampati: I  TM Distance: >3 FB Neck ROM: Full    Dental  (+) Teeth Intact, Lower Dentures Semi-permanent denture, not easily removed. :   Pulmonary neg pulmonary ROS,    breath sounds clear to auscultation       Cardiovascular negative cardio ROS   Rhythm:Regular Rate:Normal     Neuro/Psych Seizures -,  negative psych ROS   GI/Hepatic negative GI ROS, Neg liver ROS,   Endo/Other  negative endocrine ROS  Renal/GU negative Renal ROS  negative genitourinary   Musculoskeletal  (+) Arthritis , Osteoarthritis,    Abdominal   Peds negative pediatric ROS (+)  Hematology negative hematology ROS (+)   Anesthesia Other Findings   Reproductive/Obstetrics negative OB ROS                            Anesthesia Physical Anesthesia Plan  ASA: II  Anesthesia Plan: General   Post-op Pain Management:    Induction: Intravenous  Airway Management Planned: Oral ETT  Additional Equipment:   Intra-op Plan:   Post-operative Plan: Extubation in OR  Informed Consent: I have reviewed the patients History and Physical, chart, labs and discussed the procedure including the risks, benefits and alternatives for the proposed anesthesia with the patient or authorized representative who has indicated his/her understanding and acceptance.   Dental advisory given  Plan Discussed with: CRNA  Anesthesia Plan Comments:         Anesthesia Quick Evaluation

## 2016-10-08 NOTE — H&P (View-Only) (Signed)
Edward Hart 09/12/2016 8:35 AM Location: [facility] Patient #: [id number] DOB: 1955-11-04 Married / Language: Cleophus Molt / Race: White Male  History of Present Illness Adin Hector MD; 09/12/2016 9:10 AM) Patient words: NP BIH.  The patient is a 61 year old male who presents with an inguinal hernia. Note for "Inguinal hernia": Patient sent for surgical evaluation. Requested primary care physician Dr. Mayra Neer. Concern for worsening inguinal hernias.  61 year old male. [place] internationally often. Increasingly symptomatic right internal hernia. I felt like he had a small left contralateral hernia as well. I recommended laparoscopic bilateral inguinal hernia repairs with mesh in 2013. Due to his heavy travel schedule, he never felt he can get a time to get done. However the right groin swelling has gotten larger and become more painful now. Was recommended he reconsider surgery. Patient does not smoke. He can walk 2 miles a day without difficulty. Usually moves his bowels once a day. Sometimes every other day. Had an appendectomy but no other abdominal surgeries. No cardiopulmonary events. Not on blood thinners. Otherwise rather active.   Past Surgical History Yehuda Mao, RMA; 09/12/2016 8:36 AM) Appendectomy Oral Surgery  Diagnostic Studies History Yehuda Mao, RMA; 09/12/2016 8:36 AM) Colonoscopy 5-10 years ago  Allergies Shirlean Mylar Gwynn, RMA; 09/12/2016 8:39 AM) No Known Drug Allergies 09/12/2016  Medication History Shirlean Mylar Gwynn, RMA; 09/12/2016 8:44 AM) Fish Oil (435MG  Capsule, Oral) Active. Keppra (Oral) Specific strength unknown - Active. Aspirin (81MG  Tablet Chewable, Oral) Active. Medications Reconciled  Social History Yehuda Mao, RMA; 09/12/2016 8:36 AM) Alcohol use Occasional alcohol use. Caffeine use Coffee, Tea. No drug use Tobacco use Never smoker.  Family History Yehuda Mao, RMA; 09/12/2016 8:36 AM) Diabetes Mellitus  Brother.  Other Problems Yehuda Mao, RMA; 09/12/2016 8:36 AM) Kidney Stone Seizure Disorder     Review of Systems Shirlean Mylar Gwynn RMA; 09/12/2016 8:36 AM) General Not Present- Appetite Loss, Chills, Fatigue, Fever, Night Sweats, Weight Gain and Weight Loss. Skin Not Present- Change in Wart/Mole, Dryness, Hives, Jaundice, New Lesions, Non-Healing Wounds, Rash and Ulcer. HEENT Present- Wears glasses/contact lenses. Not Present- Earache, Hearing Loss, Hoarseness, Nose Bleed, Oral Ulcers, Ringing in the Ears, Seasonal Allergies, Sinus Pain, Sore Throat, Visual Disturbances and Yellow Eyes. Respiratory Not Present- Bloody sputum, Chronic Cough, Difficulty Breathing, Snoring and Wheezing. Breast Not Present- Breast Mass, Breast Pain, Nipple Discharge and Skin Changes. Cardiovascular Not Present- Chest Pain, Difficulty Breathing Lying Down, Leg Cramps, Palpitations, Rapid Heart Rate, Shortness of Breath and Swelling of Extremities. Gastrointestinal Not Present- Abdominal Pain, Bloating, Bloody Stool, Change in Bowel Habits, Chronic diarrhea, Constipation, Difficulty Swallowing, Excessive gas, Gets full quickly at meals, Hemorrhoids, Indigestion, Nausea, Rectal Pain and Vomiting. Male Genitourinary Present- Urgency. Not Present- Blood in Urine, Change in Urinary Stream, Frequency, Impotence, Nocturia, Painful Urination and Urine Leakage. Musculoskeletal Not Present- Back Pain, Joint Pain, Joint Stiffness, Muscle Pain, Muscle Weakness and Swelling of Extremities. Neurological Present- Seizures. Not Present- Decreased Memory, Fainting, Headaches, Numbness, Tingling, Tremor, Trouble walking and Weakness. Psychiatric Not Present- Anxiety, Bipolar, Change in Sleep Pattern, Depression, Fearful and Frequent crying. Endocrine Not Present- Cold Intolerance, Excessive Hunger, Hair Changes, Heat Intolerance, Hot flashes and New Diabetes. Hematology Not Present- Blood Thinners, Easy Bruising, Excessive bleeding,  Gland problems, HIV and Persistent Infections.  Vitals (Robin Gwynn RMA; 09/12/2016 8:45 AM) 09/12/2016 8:44 AM Weight: 228 lb Height: 73in Body Surface Area: 2.28 m Body Mass Index: 30.08 kg/m  Temp.: 97.21F  Pulse: 81 (Regular)  BP: 112/60 (Sitting, Left Arm, Standard)  Physical Exam Adin Hector MD; 09/12/2016 9:05 AM)  General Mental Status-Alert. General Appearance-Not in acute distress, Not Sickly. Orientation-Oriented X3. Hydration-Well hydrated. Voice-Normal.  Integumentary Global Assessment Upon inspection and palpation of skin surfaces of the - Axillae: non-tender, no inflammation or ulceration, no drainage. and Distribution of scalp and body hair is normal. General Characteristics Temperature - normal warmth is noted.  Head and Neck Head-normocephalic, atraumatic with no lesions or palpable masses. Face Global Assessment - atraumatic, no absence of expression. Neck Global Assessment - no abnormal movements, no bruit auscultated on the right, no bruit auscultated on the left, no decreased range of motion, non-tender. Trachea-midline. Thyroid Gland Characteristics - non-tender.  Eye Eyeball - Left-Extraocular movements intact, No Nystagmus. Eyeball - Right-Extraocular movements intact, No Nystagmus. Cornea - Left-No Hazy. Cornea - Right-No Hazy. Sclera/Conjunctiva - Left-No scleral icterus, No Discharge. Sclera/Conjunctiva - Right-No scleral icterus, No Discharge. Pupil - Left-Direct reaction to light normal. Pupil - Right-Direct reaction to light normal.  ENMT Ears Pinna - Left - no drainage observed, no generalized tenderness observed. Right - no drainage observed, no generalized tenderness observed. Nose and Sinuses External Inspection of the Nose - no destructive lesion observed. Inspection of the nares - Left - quiet respiration. Right - quiet respiration. Mouth and Throat Lips - Upper Lip - no  fissures observed, no pallor noted. Lower Lip - no fissures observed, no pallor noted. Nasopharynx - no discharge present. Oral Cavity/Oropharynx - Tongue - no dryness observed. Oral Mucosa - no cyanosis observed. Hypopharynx - no evidence of airway distress observed.  Chest and Lung Exam Inspection Movements - Normal and Symmetrical. Accessory muscles - No use of accessory muscles in breathing. Palpation Palpation of the chest reveals - Non-tender. Auscultation Breath sounds - Normal and Clear.  Cardiovascular Auscultation Rhythm - Regular. Murmurs & Other Heart Sounds - Auscultation of the heart reveals - No Murmurs and No Systolic Clicks.  Abdomen Inspection Inspection of the abdomen reveals - No Visible peristalsis and No Abnormal pulsations. Umbilicus - No Bleeding, No Urine drainage. Palpation/Percussion Palpation and Percussion of the abdomen reveal - Soft, Non Tender, No Rebound tenderness, No Rigidity (guarding) and No Cutaneous hyperesthesia. Note: Abdomen soft. Nontender, nondistended. No guarding. No diastasis. No umbilical nor other hernias  Male Genitourinary Sexual Maturity Tanner 5 - Adult hair pattern and Adult penile size and shape. Note: Large right inguinal hernia going down to proximal scrotum. Sensitive smaller left inguinal hernia. Normal external genitalia. Epididymi, testes, and spermatic cords normal without any masses.  Peripheral Vascular Upper Extremity Inspection - Left - No Cyanotic nailbeds, Not Ischemic. Right - No Cyanotic nailbeds, Not Ischemic.  Neurologic Neurologic evaluation reveals -normal attention span and ability to concentrate, able to name objects and repeat phrases. Appropriate fund of knowledge , normal sensation and normal coordination. Mental Status Affect - not angry, not paranoid. Cranial Nerves-Normal Bilaterally. Gait-Normal.  Neuropsychiatric Mental status exam performed with findings of-able to articulate well with  normal speech/language, rate, volume and coherence, thought content normal with ability to perform basic computations and apply abstract reasoning and no evidence of hallucinations, delusions, obsessions or homicidal/suicidal ideation.  Musculoskeletal Global Assessment Spine, Ribs and Pelvis - no instability, subluxation or laxity. Right Upper Extremity - no instability, subluxation or laxity.  Lymphatic Head & Neck  General Head & Neck Lymphatics: Bilateral - Description - No Localized lymphadenopathy. Axillary  General Axillary Region: Bilateral - Description - No Localized lymphadenopathy. Femoral & Inguinal  Generalized Femoral & Inguinal Lymphatics: Left -  Description - No Localized lymphadenopathy. Right - Description - No Localized lymphadenopathy.    Assessment & Plan Adin Hector MD; 09/12/2016 9:06 AM)  BILATERAL INGUINAL HERNIA WITHOUT OBSTRUCTION OR GANGRENE, RECURRENCE NOT SPECIFIED (K40.20) Impression: Enlarging and more symptomatic right inguinal hernia with left inguinal hernia as well.  Held off on surgery as recommended in 2013. Increasingly symptomatic so more motivated to get repaired now.  We will coordinate a convenient time. Patient travels internationally and was hoping for a few weeks from now when he has time to recover more safely. We will try to accommodate him.  PREOP - ING HERNIA - ENCOUNTER FOR PREOPERATIVE EXAMINATION FOR GENERAL SURGICAL PROCEDURE (Z01.818)  Current Plans You are being scheduled for surgery- Our schedulers will call you.  You should hear from our office's scheduling department within 5 working days about the location, date, and time of surgery. We try to make accommodations for patient's preferences in scheduling surgery, but sometimes the OR schedule or the surgeon's schedule prevents [place] from making those accommodations.  If you have not heard from our office [phone]) in 5 working days, call the office and ask for your  surgeon's nurse.  If you have other questions about your diagnosis, plan, or surgery, call the office and ask for your surgeon's nurse.  Written instructions provided The anatomy & physiology of the abdominal wall and pelvic floor was discussed. The pathophysiology of hernias in the inguinal and pelvic region was discussed. Natural history risks such as progressive enlargement, pain, incarceration, and strangulation was discussed. Contributors to complications such as smoking, obesity, diabetes, prior surgery, etc were discussed.  I feel the risks of no intervention will lead to serious problems that outweigh the operative risks; therefore, I recommended surgery to reduce and repair the hernia. I explained laparoscopic techniques with possible need for an open approach. I noted usual use of mesh to patch and/or buttress hernia repair  Risks such as bleeding, infection, abscess, need for further treatment, heart attack, death, and other risks were discussed. I noted a good likelihood this will help address the problem. Goals of post-operative recovery were discussed as well. Possibility that this will not correct all symptoms was explained. I stressed the importance of low-impact activity, aggressive pain control, avoiding constipation, & not pushing through pain to minimize risk of post-operative chronic pain or injury. Possibility of reherniation was discussed. We will work to minimize complications.  An educational handout further explaining the pathology & treatment options was given as well. Questions were answered. The patient expresses understanding & wishes to proceed with surgery.  Pt Education - Pamphlet Given - Laparoscopic Hernia Repair: discussed with patient and provided information. Pt Education - [facility] Pain Control (Emmanuel Gruenhagen) Pt Education - [facility] Hernia Post-Op HCI (Mehmet Scally): discussed with patient and provided information.  Adin Hector, M.D., F.A.C.S. Gastrointestinal  and Minimally Invasive Surgery [facility] [address] [place], [place] [address] [phone] Main / Paging

## 2016-10-09 ENCOUNTER — Encounter (HOSPITAL_BASED_OUTPATIENT_CLINIC_OR_DEPARTMENT_OTHER): Payer: Self-pay | Admitting: Surgery

## 2016-11-21 DIAGNOSIS — Z8669 Personal history of other diseases of the nervous system and sense organs: Secondary | ICD-10-CM | POA: Diagnosis not present

## 2016-11-21 DIAGNOSIS — Z79899 Other long term (current) drug therapy: Secondary | ICD-10-CM | POA: Diagnosis not present

## 2016-11-28 DIAGNOSIS — R9089 Other abnormal findings on diagnostic imaging of central nervous system: Secondary | ICD-10-CM | POA: Diagnosis not present

## 2016-11-28 DIAGNOSIS — G40209 Localization-related (focal) (partial) symptomatic epilepsy and epileptic syndromes with complex partial seizures, not intractable, without status epilepticus: Secondary | ICD-10-CM | POA: Diagnosis not present

## 2016-12-21 NOTE — Addendum Note (Signed)
Addendum  created 12/21/16 1107 by Effie Berkshire, MD   Sign clinical note

## 2017-03-13 ENCOUNTER — Other Ambulatory Visit: Payer: Self-pay | Admitting: Family Medicine

## 2017-03-13 ENCOUNTER — Ambulatory Visit
Admission: RE | Admit: 2017-03-13 | Discharge: 2017-03-13 | Disposition: A | Discharge status: Home / Self Care | Payer: BLUE CROSS/BLUE SHIELD | Source: Ambulatory Visit | Attending: Family Medicine | Admitting: Family Medicine

## 2017-03-13 DIAGNOSIS — R0600 Dyspnea, unspecified: Secondary | ICD-10-CM | POA: Diagnosis not present

## 2017-03-13 DIAGNOSIS — R0602 Shortness of breath: Secondary | ICD-10-CM | POA: Diagnosis not present

## 2017-03-13 DIAGNOSIS — F410 Panic disorder [episodic paroxysmal anxiety] without agoraphobia: Secondary | ICD-10-CM | POA: Diagnosis not present

## 2017-03-13 DIAGNOSIS — F959 Tic disorder, unspecified: Secondary | ICD-10-CM | POA: Diagnosis not present

## 2017-04-24 DIAGNOSIS — F410 Panic disorder [episodic paroxysmal anxiety] without agoraphobia: Secondary | ICD-10-CM | POA: Diagnosis not present

## 2017-04-24 DIAGNOSIS — F959 Tic disorder, unspecified: Secondary | ICD-10-CM | POA: Diagnosis not present

## 2017-04-24 DIAGNOSIS — L989 Disorder of the skin and subcutaneous tissue, unspecified: Secondary | ICD-10-CM | POA: Diagnosis not present

## 2017-05-30 DIAGNOSIS — R9089 Other abnormal findings on diagnostic imaging of central nervous system: Secondary | ICD-10-CM | POA: Diagnosis not present

## 2017-06-19 DIAGNOSIS — C4359 Malignant melanoma of other part of trunk: Secondary | ICD-10-CM | POA: Diagnosis not present

## 2017-07-05 DIAGNOSIS — C4359 Malignant melanoma of other part of trunk: Secondary | ICD-10-CM | POA: Diagnosis not present

## 2017-07-05 DIAGNOSIS — L989 Disorder of the skin and subcutaneous tissue, unspecified: Secondary | ICD-10-CM | POA: Diagnosis not present

## 2017-10-16 DIAGNOSIS — Z125 Encounter for screening for malignant neoplasm of prostate: Secondary | ICD-10-CM | POA: Diagnosis not present

## 2017-10-16 DIAGNOSIS — F411 Generalized anxiety disorder: Secondary | ICD-10-CM | POA: Diagnosis not present

## 2017-10-16 DIAGNOSIS — E782 Mixed hyperlipidemia: Secondary | ICD-10-CM | POA: Diagnosis not present

## 2017-10-16 DIAGNOSIS — Z0000 Encounter for general adult medical examination without abnormal findings: Secondary | ICD-10-CM | POA: Diagnosis not present

## 2017-10-16 DIAGNOSIS — N528 Other male erectile dysfunction: Secondary | ICD-10-CM | POA: Diagnosis not present

## 2017-10-16 DIAGNOSIS — D2271 Melanocytic nevi of right lower limb, including hip: Secondary | ICD-10-CM | POA: Diagnosis not present

## 2018-03-03 DIAGNOSIS — L570 Actinic keratosis: Secondary | ICD-10-CM | POA: Diagnosis not present

## 2018-03-03 DIAGNOSIS — L821 Other seborrheic keratosis: Secondary | ICD-10-CM | POA: Diagnosis not present

## 2018-03-03 DIAGNOSIS — D1801 Hemangioma of skin and subcutaneous tissue: Secondary | ICD-10-CM | POA: Diagnosis not present

## 2018-03-03 DIAGNOSIS — D229 Melanocytic nevi, unspecified: Secondary | ICD-10-CM | POA: Diagnosis not present

## 2018-03-09 IMAGING — DX DG CHEST 2V
2 series · 2 of 2 positions shown · non-contrast
Comparison: Chest x-ray of 05/12/2011

CLINICAL DATA: Shortness of breath for 1 month

EXAM:
CHEST  2 VIEW

[dg chest 2 view (1 of 2)]
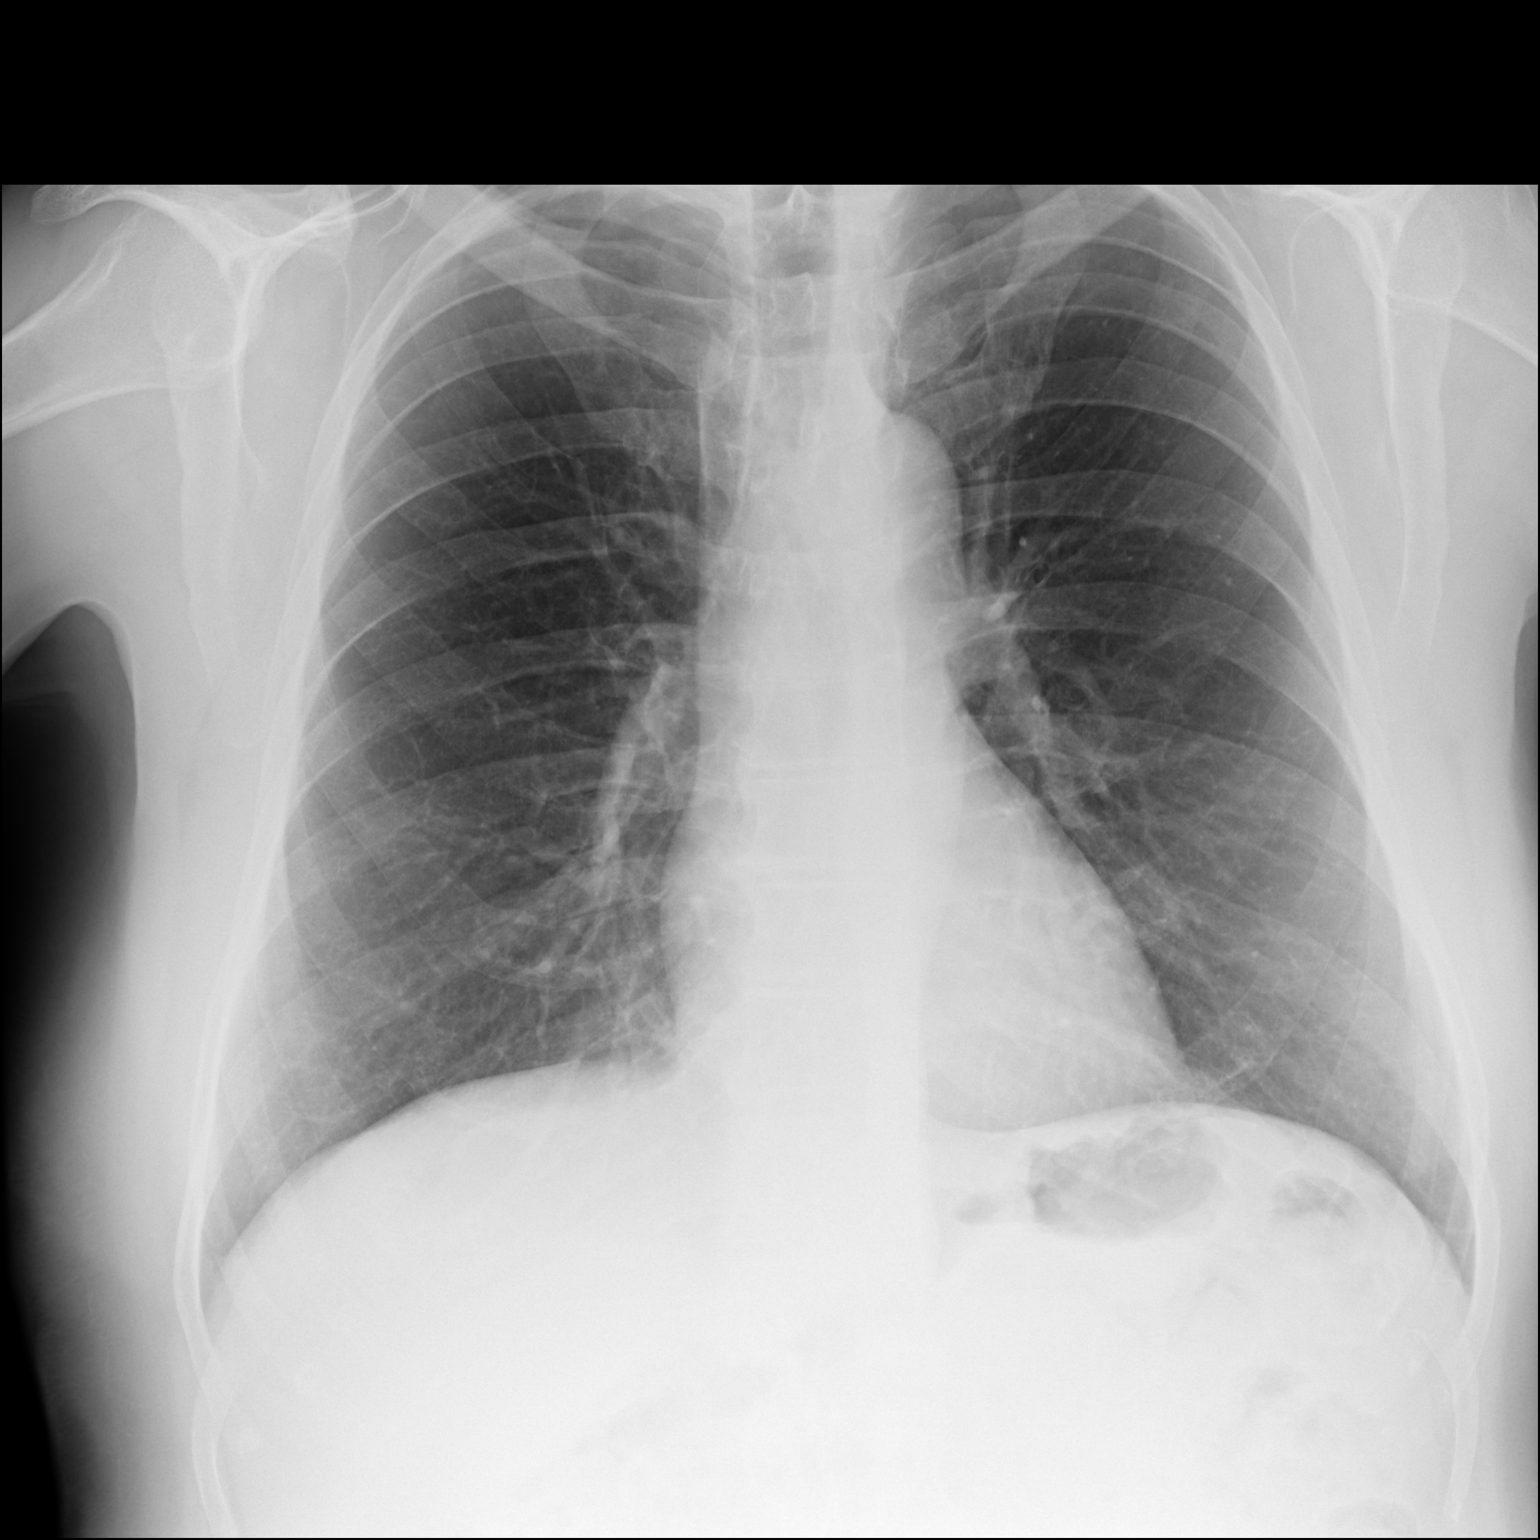

[dg chest 2 view (2 of 2)]
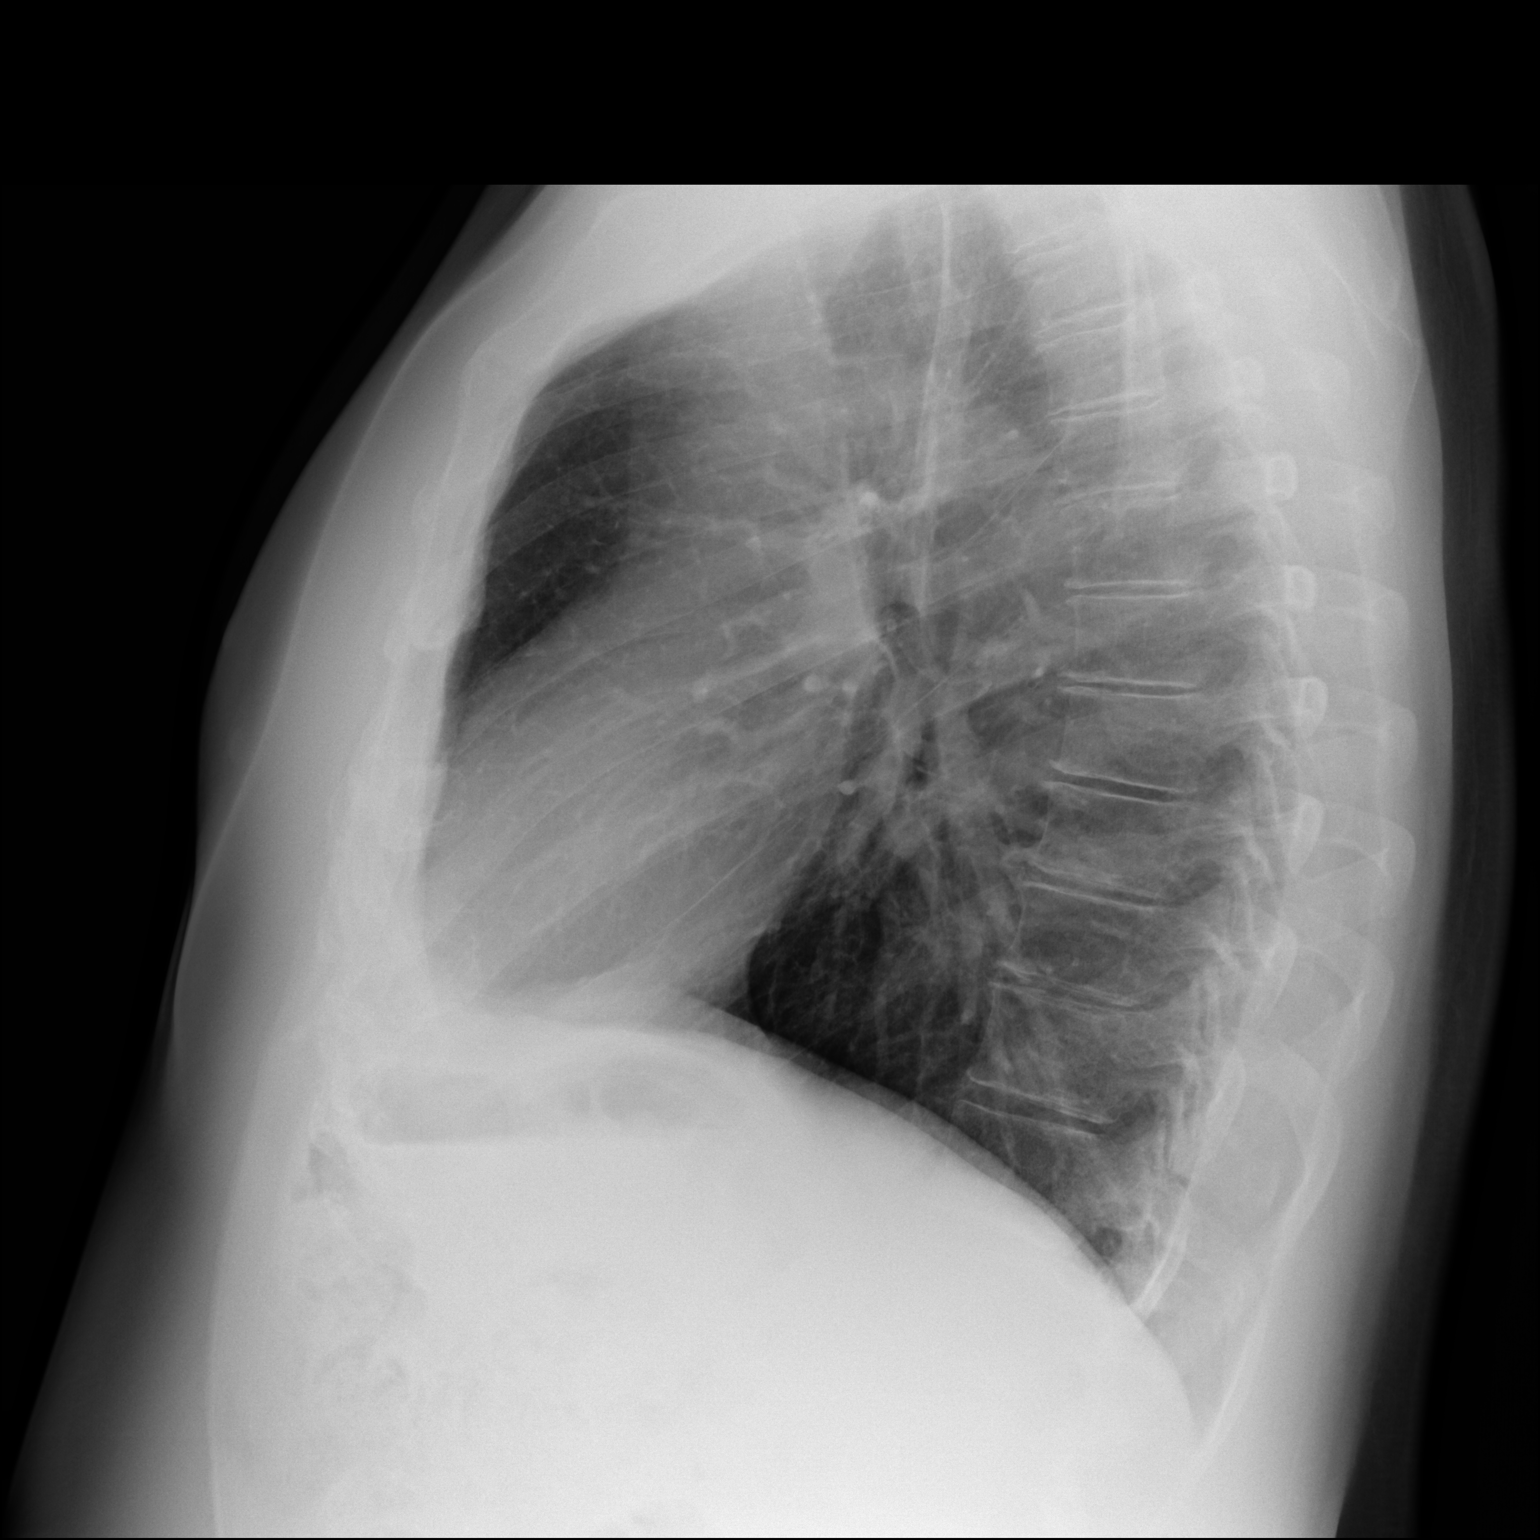

[2 of 2 positions shown; findings below may reference images not displayed]

FINDINGS: The lungs remain clear and somewhat hyperaerated. Mediastinal and
hilar contours are unremarkable. The heart is within normal limits
in size. No bony abnormality is seen.
IMPRESSION: No change in slight hyper aeration.  No active lung disease.

## 2018-04-25 DIAGNOSIS — B029 Zoster without complications: Secondary | ICD-10-CM | POA: Diagnosis not present

## 2018-06-02 DIAGNOSIS — Z23 Encounter for immunization: Secondary | ICD-10-CM | POA: Diagnosis not present

## 2019-01-09 DIAGNOSIS — L814 Other melanin hyperpigmentation: Secondary | ICD-10-CM | POA: Diagnosis not present

## 2019-01-09 DIAGNOSIS — L570 Actinic keratosis: Secondary | ICD-10-CM | POA: Diagnosis not present

## 2019-01-09 DIAGNOSIS — D229 Melanocytic nevi, unspecified: Secondary | ICD-10-CM | POA: Diagnosis not present

## 2019-01-09 DIAGNOSIS — L905 Scar conditions and fibrosis of skin: Secondary | ICD-10-CM | POA: Diagnosis not present

## 2019-01-09 DIAGNOSIS — Z85820 Personal history of malignant melanoma of skin: Secondary | ICD-10-CM | POA: Diagnosis not present

## 2019-01-09 DIAGNOSIS — L821 Other seborrheic keratosis: Secondary | ICD-10-CM | POA: Diagnosis not present

## 2019-01-19 DIAGNOSIS — Z0000 Encounter for general adult medical examination without abnormal findings: Secondary | ICD-10-CM | POA: Diagnosis not present

## 2019-01-19 DIAGNOSIS — Z125 Encounter for screening for malignant neoplasm of prostate: Secondary | ICD-10-CM | POA: Diagnosis not present

## 2019-01-19 DIAGNOSIS — E782 Mixed hyperlipidemia: Secondary | ICD-10-CM | POA: Diagnosis not present

## 2019-01-21 DIAGNOSIS — N528 Other male erectile dysfunction: Secondary | ICD-10-CM | POA: Diagnosis not present

## 2019-01-21 DIAGNOSIS — Z125 Encounter for screening for malignant neoplasm of prostate: Secondary | ICD-10-CM | POA: Diagnosis not present

## 2019-01-21 DIAGNOSIS — Z1211 Encounter for screening for malignant neoplasm of colon: Secondary | ICD-10-CM | POA: Diagnosis not present

## 2019-01-21 DIAGNOSIS — F411 Generalized anxiety disorder: Secondary | ICD-10-CM | POA: Diagnosis not present

## 2019-01-21 DIAGNOSIS — R569 Unspecified convulsions: Secondary | ICD-10-CM | POA: Diagnosis not present

## 2019-01-21 DIAGNOSIS — Z0000 Encounter for general adult medical examination without abnormal findings: Secondary | ICD-10-CM | POA: Diagnosis not present

## 2019-01-21 DIAGNOSIS — Z85820 Personal history of malignant melanoma of skin: Secondary | ICD-10-CM | POA: Diagnosis not present

## 2019-01-21 DIAGNOSIS — E782 Mixed hyperlipidemia: Secondary | ICD-10-CM | POA: Diagnosis not present

## 2019-01-21 DIAGNOSIS — Z7189 Other specified counseling: Secondary | ICD-10-CM | POA: Diagnosis not present

## 2019-01-21 DIAGNOSIS — L570 Actinic keratosis: Secondary | ICD-10-CM | POA: Diagnosis not present

## 2020-03-09 DIAGNOSIS — Z23 Encounter for immunization: Secondary | ICD-10-CM | POA: Diagnosis not present

## 2020-06-14 DIAGNOSIS — Z85820 Personal history of malignant melanoma of skin: Secondary | ICD-10-CM | POA: Diagnosis not present

## 2020-06-14 DIAGNOSIS — Z23 Encounter for immunization: Secondary | ICD-10-CM | POA: Diagnosis not present

## 2020-06-14 DIAGNOSIS — Z0000 Encounter for general adult medical examination without abnormal findings: Secondary | ICD-10-CM | POA: Diagnosis not present

## 2020-06-14 DIAGNOSIS — Z1159 Encounter for screening for other viral diseases: Secondary | ICD-10-CM | POA: Diagnosis not present

## 2020-06-14 DIAGNOSIS — E782 Mixed hyperlipidemia: Secondary | ICD-10-CM | POA: Diagnosis not present

## 2020-06-14 DIAGNOSIS — Z1211 Encounter for screening for malignant neoplasm of colon: Secondary | ICD-10-CM | POA: Diagnosis not present

## 2020-06-14 DIAGNOSIS — Z125 Encounter for screening for malignant neoplasm of prostate: Secondary | ICD-10-CM | POA: Diagnosis not present

## 2020-06-14 DIAGNOSIS — F411 Generalized anxiety disorder: Secondary | ICD-10-CM | POA: Diagnosis not present

## 2020-06-14 DIAGNOSIS — R569 Unspecified convulsions: Secondary | ICD-10-CM | POA: Diagnosis not present

## 2020-06-14 DIAGNOSIS — N528 Other male erectile dysfunction: Secondary | ICD-10-CM | POA: Diagnosis not present

## 2020-10-11 DIAGNOSIS — H0100A Unspecified blepharitis right eye, upper and lower eyelids: Secondary | ICD-10-CM | POA: Diagnosis not present

## 2020-10-11 DIAGNOSIS — H2513 Age-related nuclear cataract, bilateral: Secondary | ICD-10-CM | POA: Diagnosis not present

## 2020-10-11 DIAGNOSIS — H353111 Nonexudative age-related macular degeneration, right eye, early dry stage: Secondary | ICD-10-CM | POA: Diagnosis not present

## 2020-10-11 DIAGNOSIS — H524 Presbyopia: Secondary | ICD-10-CM | POA: Diagnosis not present

## 2020-10-11 DIAGNOSIS — H5203 Hypermetropia, bilateral: Secondary | ICD-10-CM | POA: Diagnosis not present

## 2021-06-28 DIAGNOSIS — N528 Other male erectile dysfunction: Secondary | ICD-10-CM | POA: Diagnosis not present

## 2021-06-28 DIAGNOSIS — Z85820 Personal history of malignant melanoma of skin: Secondary | ICD-10-CM | POA: Diagnosis not present

## 2021-06-28 DIAGNOSIS — E782 Mixed hyperlipidemia: Secondary | ICD-10-CM | POA: Diagnosis not present

## 2021-06-28 DIAGNOSIS — Z1211 Encounter for screening for malignant neoplasm of colon: Secondary | ICD-10-CM | POA: Diagnosis not present

## 2021-06-28 DIAGNOSIS — F411 Generalized anxiety disorder: Secondary | ICD-10-CM | POA: Diagnosis not present

## 2021-06-28 DIAGNOSIS — Z125 Encounter for screening for malignant neoplasm of prostate: Secondary | ICD-10-CM | POA: Diagnosis not present

## 2021-06-28 DIAGNOSIS — Z0000 Encounter for general adult medical examination without abnormal findings: Secondary | ICD-10-CM | POA: Diagnosis not present

## 2022-08-15 DIAGNOSIS — Z125 Encounter for screening for malignant neoplasm of prostate: Secondary | ICD-10-CM | POA: Diagnosis not present

## 2022-08-15 DIAGNOSIS — E782 Mixed hyperlipidemia: Secondary | ICD-10-CM | POA: Diagnosis not present

## 2022-08-15 DIAGNOSIS — N528 Other male erectile dysfunction: Secondary | ICD-10-CM | POA: Diagnosis not present

## 2022-08-15 DIAGNOSIS — F411 Generalized anxiety disorder: Secondary | ICD-10-CM | POA: Diagnosis not present

## 2022-08-15 DIAGNOSIS — G40209 Localization-related (focal) (partial) symptomatic epilepsy and epileptic syndromes with complex partial seizures, not intractable, without status epilepticus: Secondary | ICD-10-CM | POA: Diagnosis not present

## 2022-08-15 DIAGNOSIS — Z1211 Encounter for screening for malignant neoplasm of colon: Secondary | ICD-10-CM | POA: Diagnosis not present

## 2022-08-15 DIAGNOSIS — Z23 Encounter for immunization: Secondary | ICD-10-CM | POA: Diagnosis not present

## 2022-08-15 DIAGNOSIS — Z85820 Personal history of malignant melanoma of skin: Secondary | ICD-10-CM | POA: Diagnosis not present

## 2022-08-15 DIAGNOSIS — Z0000 Encounter for general adult medical examination without abnormal findings: Secondary | ICD-10-CM | POA: Diagnosis not present

## 2023-08-05 DIAGNOSIS — Z23 Encounter for immunization: Secondary | ICD-10-CM | POA: Diagnosis not present

## 2024-01-20 DIAGNOSIS — F411 Generalized anxiety disorder: Secondary | ICD-10-CM | POA: Diagnosis not present

## 2024-01-20 DIAGNOSIS — E782 Mixed hyperlipidemia: Secondary | ICD-10-CM | POA: Diagnosis not present

## 2024-02-19 DIAGNOSIS — R7301 Impaired fasting glucose: Secondary | ICD-10-CM | POA: Diagnosis not present

## 2024-02-19 DIAGNOSIS — E7800 Pure hypercholesterolemia, unspecified: Secondary | ICD-10-CM | POA: Diagnosis not present

## 2024-02-20 ENCOUNTER — Other Ambulatory Visit (HOSPITAL_BASED_OUTPATIENT_CLINIC_OR_DEPARTMENT_OTHER): Payer: Self-pay | Admitting: Family Medicine

## 2024-02-20 DIAGNOSIS — E7800 Pure hypercholesterolemia, unspecified: Secondary | ICD-10-CM

## 2024-02-20 DIAGNOSIS — F411 Generalized anxiety disorder: Secondary | ICD-10-CM | POA: Diagnosis not present

## 2024-02-20 DIAGNOSIS — E782 Mixed hyperlipidemia: Secondary | ICD-10-CM | POA: Diagnosis not present

## 2024-03-22 DIAGNOSIS — F411 Generalized anxiety disorder: Secondary | ICD-10-CM | POA: Diagnosis not present

## 2024-03-22 DIAGNOSIS — E782 Mixed hyperlipidemia: Secondary | ICD-10-CM | POA: Diagnosis not present

## 2024-04-21 DIAGNOSIS — F411 Generalized anxiety disorder: Secondary | ICD-10-CM | POA: Diagnosis not present

## 2024-04-21 DIAGNOSIS — E782 Mixed hyperlipidemia: Secondary | ICD-10-CM | POA: Diagnosis not present

## 2024-04-30 ENCOUNTER — Ambulatory Visit (HOSPITAL_BASED_OUTPATIENT_CLINIC_OR_DEPARTMENT_OTHER)
Admission: RE | Admit: 2024-04-30 | Discharge: 2024-04-30 | Disposition: A | Discharge status: Home / Self Care | Payer: Self-pay | Source: Ambulatory Visit | Attending: Family Medicine | Admitting: Family Medicine

## 2024-04-30 DIAGNOSIS — E7800 Pure hypercholesterolemia, unspecified: Secondary | ICD-10-CM | POA: Insufficient documentation

## 2024-05-22 DIAGNOSIS — E782 Mixed hyperlipidemia: Secondary | ICD-10-CM | POA: Diagnosis not present

## 2024-05-22 DIAGNOSIS — F411 Generalized anxiety disorder: Secondary | ICD-10-CM | POA: Diagnosis not present

## 2024-08-21 ENCOUNTER — Other Ambulatory Visit: Payer: Self-pay | Admitting: Family Medicine

## 2024-08-21 DIAGNOSIS — I77810 Thoracic aortic ectasia: Secondary | ICD-10-CM

## 2024-12-04 ENCOUNTER — Other Ambulatory Visit: Payer: Self-pay
